# Patient Record
Sex: Male | Born: 1960
Health system: Southern US, Community
[De-identification: ages and names within clinical notes are randomized; demographics above are authoritative.]

## PROBLEM LIST (undated history)

## (undated) DIAGNOSIS — E119 Type 2 diabetes mellitus without complications: Secondary | ICD-10-CM

## (undated) DIAGNOSIS — T4145XA Adverse effect of unspecified anesthetic, initial encounter: Secondary | ICD-10-CM

## (undated) DIAGNOSIS — M1990 Unspecified osteoarthritis, unspecified site: Secondary | ICD-10-CM

## (undated) DIAGNOSIS — Z9989 Dependence on other enabling machines and devices: Secondary | ICD-10-CM

## (undated) DIAGNOSIS — E538 Deficiency of other specified B group vitamins: Secondary | ICD-10-CM

## (undated) DIAGNOSIS — G629 Polyneuropathy, unspecified: Secondary | ICD-10-CM

## (undated) DIAGNOSIS — T8859XA Other complications of anesthesia, initial encounter: Secondary | ICD-10-CM

## (undated) DIAGNOSIS — G4733 Obstructive sleep apnea (adult) (pediatric): Secondary | ICD-10-CM

## (undated) DIAGNOSIS — J302 Other seasonal allergic rhinitis: Secondary | ICD-10-CM

## (undated) DIAGNOSIS — G4730 Sleep apnea, unspecified: Secondary | ICD-10-CM

## (undated) DIAGNOSIS — R000 Tachycardia, unspecified: Secondary | ICD-10-CM

## (undated) DIAGNOSIS — Z9889 Other specified postprocedural states: Secondary | ICD-10-CM

## (undated) DIAGNOSIS — E559 Vitamin D deficiency, unspecified: Secondary | ICD-10-CM

## (undated) DIAGNOSIS — C801 Malignant (primary) neoplasm, unspecified: Secondary | ICD-10-CM

## (undated) DIAGNOSIS — E1140 Type 2 diabetes mellitus with diabetic neuropathy, unspecified: Secondary | ICD-10-CM

## (undated) DIAGNOSIS — T7840XA Allergy, unspecified, initial encounter: Secondary | ICD-10-CM

## (undated) HISTORY — DX: Other seasonal allergic rhinitis: J30.2

## (undated) HISTORY — DX: Allergy, unspecified, initial encounter: T78.40XA

## (undated) HISTORY — DX: Deficiency of other specified B group vitamins: E53.8

## (undated) HISTORY — DX: Other specified postprocedural states: Z98.890

## (undated) HISTORY — DX: Malignant (primary) neoplasm, unspecified: C80.1

## (undated) HISTORY — DX: Vitamin D deficiency, unspecified: E55.9

## (undated) HISTORY — DX: Tachycardia, unspecified: R00.0

## (undated) HISTORY — DX: Polyneuropathy, unspecified: G62.9

## (undated) HISTORY — DX: Unspecified osteoarthritis, unspecified site: M19.90

## (undated) HISTORY — DX: Obstructive sleep apnea (adult) (pediatric): G47.33

## (undated) HISTORY — DX: Type 2 diabetes mellitus with diabetic neuropathy, unspecified: E11.40

## (undated) HISTORY — DX: Type 2 diabetes mellitus without complications: E11.9

## (undated) HISTORY — DX: Sleep apnea, unspecified: G47.30

## (undated) HISTORY — PX: COLOSTOMY REVISION: SHX5232

## (undated) HISTORY — PX: VASECTOMY: SHX75

## (undated) HISTORY — PX: COLONOSCOPY: SHX174

## (undated) HISTORY — DX: Dependence on other enabling machines and devices: Z99.89

## (undated) HISTORY — PX: OTHER SURGICAL HISTORY: SHX169

## (undated) HISTORY — PX: EYE SURGERY: SHX253

---

## 2005-04-06 HISTORY — PX: OTHER SURGICAL HISTORY: SHX169

## 2005-04-06 HISTORY — PX: KNEE SURGERY: SHX244

## 2006-04-06 HISTORY — PX: OTHER SURGICAL HISTORY: SHX169

## 2006-04-06 HISTORY — PX: COLOSTOMY TAKEDOWN: SHX5783

## 2010-04-06 HISTORY — PX: COLON RESECTION: SHX5231

## 2012-10-04 ENCOUNTER — Encounter: Payer: Self-pay | Admitting: *Deleted

## 2012-10-05 ENCOUNTER — Encounter: Payer: Self-pay | Admitting: Neurology

## 2012-10-05 ENCOUNTER — Ambulatory Visit (INDEPENDENT_AMBULATORY_CARE_PROVIDER_SITE_OTHER): Payer: BC Managed Care – PPO | Admitting: Neurology

## 2012-10-05 VITALS — BP 109/76 | HR 86 | Ht 69.0 in | Wt 256.0 lb

## 2012-10-05 DIAGNOSIS — C801 Malignant (primary) neoplasm, unspecified: Secondary | ICD-10-CM | POA: Insufficient documentation

## 2012-10-05 DIAGNOSIS — E1140 Type 2 diabetes mellitus with diabetic neuropathy, unspecified: Secondary | ICD-10-CM

## 2012-10-05 DIAGNOSIS — E1142 Type 2 diabetes mellitus with diabetic polyneuropathy: Secondary | ICD-10-CM

## 2012-10-05 DIAGNOSIS — G629 Polyneuropathy, unspecified: Secondary | ICD-10-CM | POA: Insufficient documentation

## 2012-10-05 DIAGNOSIS — R000 Tachycardia, unspecified: Secondary | ICD-10-CM

## 2012-10-05 DIAGNOSIS — J302 Other seasonal allergic rhinitis: Secondary | ICD-10-CM | POA: Insufficient documentation

## 2012-10-05 DIAGNOSIS — Z9889 Other specified postprocedural states: Secondary | ICD-10-CM

## 2012-10-05 DIAGNOSIS — E559 Vitamin D deficiency, unspecified: Secondary | ICD-10-CM

## 2012-10-05 DIAGNOSIS — E119 Type 2 diabetes mellitus without complications: Secondary | ICD-10-CM

## 2012-10-05 DIAGNOSIS — G609 Hereditary and idiopathic neuropathy, unspecified: Secondary | ICD-10-CM

## 2012-10-05 DIAGNOSIS — I498 Other specified cardiac arrhythmias: Secondary | ICD-10-CM

## 2012-10-05 DIAGNOSIS — E1149 Type 2 diabetes mellitus with other diabetic neurological complication: Secondary | ICD-10-CM

## 2012-10-05 NOTE — Progress Notes (Signed)
[facility]  PATIENT: James Buck DOB: [birth year]  HISTORICAL James Buck is a 52 years old right-handed Caucasian male, referred by his primary care physician Dr. Lanelle Bal for evaluation of bilateral lower extremity heaviness, lack of stamina  He is a retired IT sales professional, in 2007, he developed Armed forces operational officer, severe constipation, was diagnosed with colon cancer, he had colectomy, followed by chemotherapy, this was done at [place] [place] [facility], he was treated with FOLFOX Folinic acid, Fluorouracil, oxaliplatin,  at the end of chemotherapy treatment, he developed bilateral feet paresthesia, a stroke about one year to recover, he was able to go back to work full time as a IT sales professional,  But in 2012, during regular followup CT of abdomen, he was found to have metastatic abdominal lymph nodes, he underwent second surgery, followed by chemotherapy again, he developed worsening bilateral feet paresthesia, but he also developed fatigue, lack of stamina, could no longer go back to work as a IT sales professional,  He now has mild improvement, but continued to complain bilateral lower extremity heaviness, his ankle gave out underneath him, he walks 1-2 miles, but after that, he felt bilateral lower lower extremity heaviness, he has to rest 15-20 minutes to be able to continue, he denies significant low back pain, he denies bowel and bladder incontinence,  He also complains of lightheadedness, He denies bilateral fingertips paresthesia,   REVIEW OF SYSTEMS: Full 14 system review of systems performed and notable only for blurred vision, hearing loss, weakness, dizziness, snoring, restless leg  ALLERGIES: Allergies  Allergen Reactions  . Victoza (Liraglutide)     Prior nausea when taken in AM (tolerates bedtime dosing with no difficulty)    HOME MEDICATIONS: Outpatient Prescriptions Prior to Visit  Medication Sig Dispense Refill  . aspirin 81 MG tablet Take 81 mg by mouth  daily.      . Canagliflozin (INVOKANA) 300 MG TABS Take 300 mg by mouth. One tablet before breakfast once daily      . Cholecalciferol (VITAMIN D3) 5000 UNITS CAPS Take by mouth. Once daily      . Cyanocobalamin (VITAMIN B-12) 500 MCG SUBL Place 500 mcg under the tongue daily.      . fluticasone (FLONASE) 50 MCG/ACT nasal spray Place 2 sprays into the nose daily.      Marland Kitchen glucose blood test strip 1 each. Use as instructed. Check cbg q am, alternatinf between AM and PM mels. AC   QID      . Insulin Pen Needle (NOVOFINE) 32G X 6 MM MISC As directed once daily      . Liraglutide (VICTOZA [place]) Inject into the skin. 1.2 - 1.4 mg once a day      . metFORMIN (GLUCOPHAGE-XR) 500 MG 24 hr tablet Take 1,000 mg by mouth 2 (two) times daily. With meals      . loratadine (CLARITIN) 10 MG tablet Take 10 mg by mouth daily as needed for allergies.       No facility-administered medications prior to visit.    PAST MEDICAL HISTORY: Past Medical History  Diagnosis Date  . Diabetes mellitus without complication Since 2007  . Vitamin D deficiency   . Vitamin B12 deficiency   . Cancer Colon 12/2005,   . Peripheral neuropathy   . Sinus tachycardia   . OSA on CPAP   . Seasonal allergies   . Diabetic neuropathy     PAST SURGICAL HISTORY: Past Surgical History  Procedure Laterality Date  . Colon cancer resection  2007  chemotherapy, 2/12 malignant abdominal LN (more chemotherapy) 4/12-9/12.   . Vasectomy    . Knee surgery Right 2007  . [place] cath      placement 2012 removed 12/2010  . Colostomy takedown in 2008  2008    colostomy in 2007  . Angiograms  2008    2 venograms  for SVC blockages    FAMILY HISTORY: No family history on file.  SOCIAL HISTORY:  History   Social History  . Marital Status: Single    Spouse Name: N/A    Number of Children: 2  . Years of Education: 12   Occupational History  .      retired   Social History Main Topics  . Smoking status: Former Smoker    Quit  date: 04/07/1987  . Smokeless tobacco: Never Used  . Alcohol Use: 0.6 oz/week    1 Cans of beer per week     Comment: SOCIAL  . Drug Use: No  . Sexually Active: Not on file   Other Topics Concern  . Not on file   Social History Narrative   Patient is retired.   High school education.    Patient has two children.     PHYSICAL EXAM  Filed Vitals:   10/05/12 0829  BP: 109/76  Pulse: 86  Height: 5\' 9"  (1.753 m)  Weight: 256 lb (116.121 kg)    Not recorded    Body mass index is 37.79 kg/(m^2).   Generalized: In no acute distress  Neck: Supple, no carotid bruits   Cardiac: Regular rate rhythm  Pulmonary: Clear to auscultation bilaterally  Musculoskeletal: No deformity  Neurological examination  Mentation: Alert oriented to time, place, history taking, and causual conversation  Cranial nerve II-XII: Pupils were equal round reactive to light extraocular movements were full, visual field were full on confrontational test. facial sensation and strength were normal. hearing was intact to finger rubbing bilaterally. Uvula tongue midline.  head turning and shoulder shrug and were normal and symmetric.Tongue protrusion into cheek strength was normal.  Motor: normal tone, bulk and strength.  Sensory: mildly length dependent decreased fine touch, pinprick to ankle level, preserved vibratory sensation, and proprioception at toes.  Coordination: Normal finger to nose, heel-to-shin bilaterally there was no truncal ataxia  Gait: Rising up from seated position without assistance, normal stance, without trunk ataxia, moderate stride, good arm swing, smooth turning, able to perform tiptoe, and heel walking without difficulty.   Romberg signs: Negative  Deep tendon reflexes: Brachioradialis 2/2, biceps 2/2, triceps 2/2, patellar 2/2, Achilles 1/1, plantar responses were flexor bilaterally.   DIAGNOSTIC DATA (LABS, IMAGING, TESTING) - I reviewed patient records, labs, notes,  testing and imaging myself where available.  ASSESSMENT AND PLAN:  52 years old right-handed Caucasian male, with past medical history of colon cancer, status post colectomy, metastatic lymph node, chemotherapy twice, the first one was in 2007, the 2nd one was in 2012, there was mildly length dependent sensory changes on examination.   1. He complains of lack of stamina, likely combination of chemotherapy, deconditioning, also peripheral neuropathy 2. laboratory evaluation including B12, TSH, common etiology for peripheral neuropathy 3. arterial Doppler study to rule out vascular insufficiency,  4 EMG nerve conduction study  .    Levert Feinstein, M.D. Ph.D.  [facility] [address], [address] [place], [place] [address] [phone]

## 2012-10-10 LAB — IFE AND PE, SERUM
Albumin/Glob SerPl: 1.6 (ref 0.7–2.0)
Alpha 1: 0.2 g/dL (ref 0.1–0.4)
Globulin, Total: 2.6 g/dL (ref 2.0–4.5)
IgA/Immunoglobulin A, Serum: 140 mg/dL (ref 91–414)
IgG (Immunoglobin G), Serum: 744 mg/dL (ref 700–1600)
IgM (Immunoglobulin M), Srm: 96 mg/dL (ref 40–230)

## 2012-10-10 LAB — CK: Total CK: 69 U/L (ref 24–204)

## 2012-10-10 LAB — TSH: TSH: 0.903 u[IU]/mL (ref 0.450–4.500)

## 2012-10-10 LAB — RPR: RPR: NONREACTIVE

## 2012-10-10 LAB — VITAMIN B12: Vitamin B-12: 830 pg/mL (ref 211–946)

## 2012-10-18 ENCOUNTER — Encounter (INDEPENDENT_AMBULATORY_CARE_PROVIDER_SITE_OTHER): Payer: BC Managed Care – PPO | Admitting: Neurology

## 2012-10-18 ENCOUNTER — Ambulatory Visit (INDEPENDENT_AMBULATORY_CARE_PROVIDER_SITE_OTHER): Payer: BC Managed Care – PPO | Admitting: Radiology

## 2012-10-18 DIAGNOSIS — Z0289 Encounter for other administrative examinations: Secondary | ICD-10-CM

## 2012-10-18 DIAGNOSIS — E119 Type 2 diabetes mellitus without complications: Secondary | ICD-10-CM

## 2012-10-18 DIAGNOSIS — Z9889 Other specified postprocedural states: Secondary | ICD-10-CM

## 2012-10-18 DIAGNOSIS — G609 Hereditary and idiopathic neuropathy, unspecified: Secondary | ICD-10-CM

## 2012-10-18 DIAGNOSIS — E559 Vitamin D deficiency, unspecified: Secondary | ICD-10-CM

## 2012-10-18 DIAGNOSIS — C801 Malignant (primary) neoplasm, unspecified: Secondary | ICD-10-CM

## 2012-10-18 DIAGNOSIS — E1140 Type 2 diabetes mellitus with diabetic neuropathy, unspecified: Secondary | ICD-10-CM

## 2012-10-18 DIAGNOSIS — R000 Tachycardia, unspecified: Secondary | ICD-10-CM

## 2012-10-26 NOTE — Progress Notes (Signed)
This encounter was created in error - please disregard.

## 2012-10-31 NOTE — Addendum Note (Signed)
Addended byHermenia Fiscal on: 10/31/2012 03:02 PM   Modules accepted: Orders

## 2012-11-02 NOTE — Procedures (Signed)
    [facility]  NCS (NERVE CONDUCTION STUDY) WITH EMG (ELECTROMYOGRAPHY) REPORT   STUDY DATE: 10/18/2012 PATIENT NAME: James Buck DOB: Oct 31, 1960 MRN: [id number]    TECHNOLOGIST: Kaylyn Lim ELECTROMYOGRAPHER: Levert Feinstein M.D.  CLINICAL INFORMATION:   52 years old man, with history of colon cancer, status post chemotherapy, presenting with bilateral lower extremity heaviness, there was length dependent sensory changes on examination.  FINDINGS: NERVE CONDUCTION STUDY: Bilateral sural sensory responses have mildly low SNAP amplitude, with normal peak latency. Bilateral peroneal and tibial motor responses were normal. Left median, ulnar sensory and motor responses were normal  NEEDLE ELECTROMYOGRAPHY:  Selected needle examination was performed at the left lower extremity muscles, left lumbosacral paraspinal muscles.  Needle examination of left tibialis anterior, tibialis posterior, medial gastrocnemius, vastus lateralis, biceps femoris long head was normal.  There was no spontaneous activity at left lumbosacral paraspinal muscles, left L4, L5, S1.  IMPRESSION:   This is a slight abnormal study.  There is electrodiagnostic evidence of slight length dependent axonal sensory predominant peripheral neuropathy. There was no evidence of left lumbosacral radiculopathy      INTERPRETING PHYSICIAN:   Levert Feinstein M.D. Ph.D. [facility] [address] [place], [place] [address] [phone]

## 2012-11-18 DIAGNOSIS — R0989 Other specified symptoms and signs involving the circulatory and respiratory systems: Secondary | ICD-10-CM

## 2013-01-31 ENCOUNTER — Ambulatory Visit (HOSPITAL_COMMUNITY): Payer: BC Managed Care – PPO | Attending: Neurology

## 2013-01-31 ENCOUNTER — Encounter (INDEPENDENT_AMBULATORY_CARE_PROVIDER_SITE_OTHER): Payer: Self-pay

## 2013-01-31 DIAGNOSIS — E1149 Type 2 diabetes mellitus with other diabetic neurological complication: Secondary | ICD-10-CM

## 2013-01-31 DIAGNOSIS — R209 Unspecified disturbances of skin sensation: Secondary | ICD-10-CM | POA: Insufficient documentation

## 2013-01-31 DIAGNOSIS — E1159 Type 2 diabetes mellitus with other circulatory complications: Secondary | ICD-10-CM

## 2013-01-31 DIAGNOSIS — Z87891 Personal history of nicotine dependence: Secondary | ICD-10-CM | POA: Insufficient documentation

## 2013-01-31 DIAGNOSIS — E1142 Type 2 diabetes mellitus with diabetic polyneuropathy: Secondary | ICD-10-CM

## 2013-04-18 ENCOUNTER — Ambulatory Visit (INDEPENDENT_AMBULATORY_CARE_PROVIDER_SITE_OTHER): Payer: BC Managed Care – PPO | Admitting: Neurology

## 2013-04-18 ENCOUNTER — Encounter (INDEPENDENT_AMBULATORY_CARE_PROVIDER_SITE_OTHER): Payer: Self-pay

## 2013-04-18 ENCOUNTER — Encounter: Payer: Self-pay | Admitting: Neurology

## 2013-04-18 VITALS — BP 126/73 | HR 93 | Ht 70.75 in | Wt 258.0 lb

## 2013-04-18 DIAGNOSIS — E119 Type 2 diabetes mellitus without complications: Secondary | ICD-10-CM

## 2013-04-18 DIAGNOSIS — G609 Hereditary and idiopathic neuropathy, unspecified: Secondary | ICD-10-CM

## 2013-04-18 DIAGNOSIS — G629 Polyneuropathy, unspecified: Secondary | ICD-10-CM

## 2013-04-18 DIAGNOSIS — E559 Vitamin D deficiency, unspecified: Secondary | ICD-10-CM

## 2013-04-18 DIAGNOSIS — C801 Malignant (primary) neoplasm, unspecified: Secondary | ICD-10-CM

## 2013-04-18 MED ORDER — L-METHYLFOLATE-B6-B12 3-35-2 MG PO TABS: 1.0000 | ORAL_TABLET | Freq: Every day | ORAL | Status: DC

## 2013-04-18 NOTE — Progress Notes (Signed)
[facility]  PATIENT: James Buck DOB: [birth year]  HISTORICAL James Buck is a 53 years old right-handed Caucasian male, referred by his primary care physician James Buck for evaluation of bilateral lower extremity heaviness, lack of stamina  He is a retired Airline pilot, in 2007, he developed Journalist, newspaper, severe constipation, was diagnosed with colon cancer, he had colectomy, followed by chemotherapy, this was done at [place], [place] cancer centers, he was treated with FOLFOX Folinic acid, Fluorouracil, oxaliplatin,  at the end of chemotherapy treatment, he developed bilateral feet paresthesia, but recovered almost to baseline,  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  He developed right superior venous thrombosis from his port cath, he was treated with coumadin, he has to have stent to the right superior vensou cava.  But in 2012, during regular followup CT of abdomen, he was found to have metastatic abdominal lymph nodes, he underwent second surgery, followed by chemotherapy again, he developed worsening bilateral feet paresthesia, but he also developed fatigue, lack of stamina, could no longer go back to work as a Airline pilot, went on disability in 2012.  He now has mild improvement, but continued to complain bilateral lower extremity heaviness, his leg gave out underneath him, he walks 1-2 miles, but after that, he felt bilateral lower lower extremity heaviness, he has to rest 15-20 minutes to be able to continue, he denies significant low back pain, he denies bowel and bladder incontinence,  He also complains of lightheadedness, He denies bilateral fingertips paresthesia.  EMG nerve conduction study in July 2014 showed mild axonal sensorimotor polyneuropathy  Laboratory showed normal or negative B12, RPR, ANA, TSH,  UPDATE Jan 13th 2015;  He is still able to work briskly 2 miles with out much difficulty, he has difficulty running, still  driving, no trouble walking, he denies low back pain, no incontinence, paresthesia at his plantar feet, tingling all the time, numbness at his feet, no pain.  His hands feels buring, tingling when his hands get cold.  REVIEW OF SYSTEMS: Full 14 system review of systems performed and notable only for bilateral feet discomfort   ALLERGIES: Allergies  Allergen Reactions  . Victoza [Liraglutide]     Prior nausea when taken in AM (tolerates bedtime dosing with no difficulty)    HOME MEDICATIONS: Outpatient Prescriptions Prior to Visit  Medication Sig Dispense Refill  . aspirin 81 MG tablet Take 81 mg by mouth daily.      . Canagliflozin (INVOKANA) 300 MG TABS Take 300 mg by mouth. One tablet before breakfast once daily      . Cholecalciferol (VITAMIN D3) 5000 UNITS CAPS Take by mouth. Once daily      . Cyanocobalamin (VITAMIN B-12) 500 MCG SUBL Place 500 mcg under the tongue daily.      . fluticasone (FLONASE) 50 MCG/ACT nasal spray Place 2 sprays into the nose daily.      Marland Kitchen glucose blood test strip 1 each. Use as instructed. Check cbg q am, alternatinf between AM and PM mels. AC   QID      . Liraglutide (VICTOZA [place]) Inject into the skin. 1.2 - 1.4 mg once a day      . metFORMIN (GLUCOPHAGE-XR) 500 MG 24 hr tablet Take 1,000 mg by mouth 2 (two) times daily. With meals      . Insulin Pen Needle (NOVOFINE) 32G X 6 MM MISC As directed once daily       No facility-administered medications  prior to visit.    PAST MEDICAL HISTORY: Past Medical History  Diagnosis Date  . Diabetes mellitus without complication Since [id number]  . Vitamin D deficiency   . Vitamin B12 deficiency   . Cancer Colon 12/2005,   . Peripheral neuropathy   . Sinus tachycardia   . OSA on CPAP   . Seasonal allergies   . Diabetic neuropathy     PAST SURGICAL HISTORY: Past Surgical History  Procedure Laterality Date  . Colon cancer resection  2007     chemotherapy, 2/12 malignant abdominal LN (more chemotherapy)  4/12-9/12.   . Vasectomy    . Knee surgery Right 2007  . [place] cath      placement 2012 removed 12/2010  . Colostomy takedown in 2008  2008    colostomy in 2007  . Angiograms  2008    2 venograms  for SVC blockages    FAMILY HISTORY: History reviewed. No pertinent family history.  SOCIAL HISTORY:  History   Social History  . Marital Status: Single    Spouse Name: N/A    Number of Children: 2  . Years of Education: 12   Occupational History  .      retired   Social History Main Topics  . Smoking status: Former Smoker    Quit date: 04/07/1987  . Smokeless tobacco: Never Used  . Alcohol Use: 0.6 oz/week    1 Cans of beer per week     Comment: SOCIAL  . Drug Use: No  . Sexual Activity: Not on file   Other Topics Concern  . Not on file   Social History Narrative   Patient is retired.   High school education.    Patient has two children.   Caffeine consumption is 2 daily   Patient is right handed     PHYSICAL EXAM  Filed Vitals:   04/18/13 1429  BP: 126/73  Pulse: 93  Height: 5' 10.75" (1.797 m)  Weight: 258 lb (117.028 kg)    Not recorded    Body mass index is 36.24 kg/(m^2).   Generalized: In no acute distress  Neck: Supple, no carotid bruits   Cardiac: Regular rate rhythm  Pulmonary: Clear to auscultation bilaterally  Musculoskeletal: No deformity  Neurological examination  Mentation: Alert oriented to time, place, history taking, and causual conversation  Cranial nerve II-XII: Pupils were equal round reactive to light extraocular movements were full, visual field were full on confrontational test. facial sensation and strength were normal. hearing was intact to finger rubbing bilaterally. Uvula tongue midline.  head turning and shoulder shrug and were normal and symmetric.Tongue protrusion into cheek strength was normal.  Motor: normal tone, bulk and strength.  Sensory: mildly length dependent decreased fine touch, pinprick to ankle  level, preserved vibratory sensation, and proprioception at toes.  Coordination: Normal finger to nose, heel-to-shin bilaterally there was no truncal ataxia  Gait: Rising up from seated position without assistance, normal stance, without trunk ataxia, moderate stride, good arm swing, smooth turning, able to perform tiptoe, and heel walking without difficulty.   Romberg signs: Negative  Deep tendon reflexes: Brachioradialis 2/2, biceps 2/2, triceps 2/2, patellar 2/2, Achilles 1/1, plantar responses were flexor bilaterally.   DIAGNOSTIC DATA (LABS, IMAGING, TESTING) - I reviewed patient records, labs, notes, testing and imaging myself where available.  ASSESSMENT AND PLAN:  53 years old right-handed Caucasian male, with past medical history of colon cancer, status post colectomy, metastatic lymph node, chemotherapy twice, the first one was in  2007, the 2nd one was in 2012, there was mildly length dependent sensory changes following his chemotherapy, No other treatable cause found, he is highly functional, does not need symptomatic treatment at this point, will only followup with our clinic if new issue arise  Marcial Pacas, M.D. Ph.D.  [facility] [address], [place], [place] [address] [phone]

## 2013-10-26 ENCOUNTER — Telehealth: Payer: Self-pay | Admitting: Oncology

## 2013-10-26 NOTE — Telephone Encounter (Signed)
S/W PATIENT PER PATIENT HAS AN APPT @ [facility] IN [place] ON TODAY AND WILL INFORMED OFFICE TO FAXED RECORDS TO [place].

## 2013-11-14 ENCOUNTER — Telehealth: Payer: Self-pay | Admitting: Oncology

## 2013-11-14 NOTE — Telephone Encounter (Signed)
C/D 11/14/13 for appt. 12/05/13

## 2013-11-29 ENCOUNTER — Other Ambulatory Visit: Payer: Self-pay | Admitting: Oncology

## 2013-11-29 DIAGNOSIS — C189 Malignant neoplasm of colon, unspecified: Secondary | ICD-10-CM

## 2013-12-04 ENCOUNTER — Telehealth: Payer: Self-pay | Admitting: Medical Oncology

## 2013-12-04 NOTE — Telephone Encounter (Signed)
LVMOM with patient regarding tomorrow's appt.

## 2013-12-05 ENCOUNTER — Ambulatory Visit: Payer: BC Managed Care – PPO

## 2013-12-05 ENCOUNTER — Ambulatory Visit: Payer: BC Managed Care – PPO | Admitting: Oncology

## 2013-12-05 ENCOUNTER — Other Ambulatory Visit: Payer: BC Managed Care – PPO

## 2013-12-19 ENCOUNTER — Encounter: Payer: BC Managed Care – PPO | Attending: Internal Medicine

## 2013-12-19 VITALS — Ht 69.0 in | Wt 248.0 lb

## 2013-12-19 DIAGNOSIS — E119 Type 2 diabetes mellitus without complications: Secondary | ICD-10-CM | POA: Insufficient documentation

## 2013-12-19 DIAGNOSIS — Z713 Dietary counseling and surveillance: Secondary | ICD-10-CM | POA: Insufficient documentation

## 2013-12-21 NOTE — Progress Notes (Signed)

## 2013-12-26 DIAGNOSIS — E119 Type 2 diabetes mellitus without complications: Secondary | ICD-10-CM | POA: Diagnosis not present

## 2013-12-26 NOTE — Progress Notes (Signed)

## 2014-01-02 ENCOUNTER — Ambulatory Visit: Payer: BC Managed Care – PPO

## 2014-05-16 ENCOUNTER — Telehealth: Payer: Self-pay | Admitting: Oncology

## 2014-05-16 NOTE — Telephone Encounter (Signed)
PT CALLED TO R/S NP APPT WITH DR Alen Blew ON 06/12/14@10 :30

## 2014-05-18 ENCOUNTER — Telehealth: Payer: Self-pay | Admitting: Oncology

## 2014-05-18 NOTE — Telephone Encounter (Signed)
Chart delivered °

## 2014-06-12 ENCOUNTER — Other Ambulatory Visit (HOSPITAL_BASED_OUTPATIENT_CLINIC_OR_DEPARTMENT_OTHER): Payer: BLUE CROSS/BLUE SHIELD

## 2014-06-12 ENCOUNTER — Ambulatory Visit: Payer: BLUE CROSS/BLUE SHIELD

## 2014-06-12 ENCOUNTER — Encounter: Payer: Self-pay | Admitting: Oncology

## 2014-06-12 ENCOUNTER — Ambulatory Visit (HOSPITAL_BASED_OUTPATIENT_CLINIC_OR_DEPARTMENT_OTHER): Payer: BLUE CROSS/BLUE SHIELD | Admitting: Oncology

## 2014-06-12 ENCOUNTER — Telehealth: Payer: Self-pay | Admitting: Oncology

## 2014-06-12 VITALS — BP 128/76 | HR 85 | Temp 97.8°F | Resp 18 | Ht 69.0 in | Wt 260.3 lb

## 2014-06-12 DIAGNOSIS — C779 Secondary and unspecified malignant neoplasm of lymph node, unspecified: Secondary | ICD-10-CM

## 2014-06-12 DIAGNOSIS — C189 Malignant neoplasm of colon, unspecified: Secondary | ICD-10-CM

## 2014-06-12 LAB — COMPREHENSIVE METABOLIC PANEL (CC13)
ALT: 51 U/L (ref 0–55)
AST: 28 U/L (ref 5–34)
Albumin: 4 g/dL (ref 3.5–5.0)
Alkaline Phosphatase: 62 U/L (ref 40–150)
Anion Gap: 12 mEq/L — ABNORMAL HIGH (ref 3–11)
BUN: 15.4 mg/dL (ref 7.0–26.0)
CO2: 21 mEq/L — ABNORMAL LOW (ref 22–29)
CREATININE: 1 mg/dL (ref 0.7–1.3)
Calcium: 9.2 mg/dL (ref 8.4–10.4)
Chloride: 106 mEq/L (ref 98–109)
EGFR: 89 mL/min/{1.73_m2} — ABNORMAL LOW (ref >90)
Glucose: 160 mg/dl — ABNORMAL HIGH (ref 70–140)
Potassium: 4.1 mEq/L (ref 3.5–5.1)
SODIUM: 139 meq/L (ref 136–145)
Total Bilirubin: 1.29 mg/dL — ABNORMAL HIGH (ref 0.20–1.20)
Total Protein: 6.6 g/dL (ref 6.4–8.3)

## 2014-06-12 LAB — CBC WITH DIFFERENTIAL/PLATELET
BASO%: 0.6 % (ref 0.0–2.0)
BASOS ABS: 0 10*3/uL (ref 0.0–0.1)
EOS%: 4.3 % (ref 0.0–7.0)
Eosinophils Absolute: 0.3 10*3/uL (ref 0.0–0.5)
HEMATOCRIT: 47.1 % (ref 38.4–49.9)
HEMOGLOBIN: 16.2 g/dL (ref 13.0–17.1)
LYMPH#: 1.3 10*3/uL (ref 0.9–3.3)
LYMPH%: 20.1 % (ref 14.0–49.0)
MCH: 32.5 pg (ref 27.2–33.4)
MCHC: 34.4 g/dL (ref 32.0–36.0)
MCV: 94.6 fL (ref 79.3–98.0)
MONO#: 0.5 10*3/uL (ref 0.1–0.9)
MONO%: 7.4 % (ref 0.0–14.0)
NEUT#: 4.2 10*3/uL (ref 1.5–6.5)
NEUT%: 67.6 % (ref 39.0–75.0)
PLATELETS: 204 10*3/uL (ref 140–400)
RBC: 4.98 10*6/uL (ref 4.20–5.82)
RDW: 13.1 % (ref 11.0–14.6)
WBC: 6.2 10*3/uL (ref 4.0–10.3)

## 2014-06-12 NOTE — Progress Notes (Signed)
Please see consult note.  

## 2014-06-12 NOTE — Telephone Encounter (Signed)
S/w Amy pt had colonoscopy done in [place], per Amy have pt come to [place] basement to fill out request form for medical information from [facility] [phone]. I gave pt information to take to [place] for his paperwork, once they receive this information from the referral they will call pt to schedule an appointment... KJ  Pt confirmed labs/ov per 03/08 POF, gave pt AVS... KJ, gave pt barium

## 2014-06-12 NOTE — Progress Notes (Signed)
Checked in new pt with no financial concerns at this time.  Pt has my card for any billing questions or concerns. °

## 2014-06-12 NOTE — Consult Note (Signed)
Reason for Referral:  Colon cancer.   HPI: 54 year old gentleman native of [place] but lived in the [place] area for about 20 some years. 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 He was diagnosed with colon cancer in 2007 after he presented with complete obstruction and underwent urgent surgery. He found to have from his description stage III colon cancer with positive lymph nodes at that time. The exact details of his care is not available to me at this time. He underwent a FOLFOX adjuvant chemotherapy that was poorly tolerated but was able to receive close to 8 cycles. He underwent a colostomy reversal after that. He had a tumor recurrence around 2012 with a positive lymph gland in the abdominal area. That was surgically removed and received 6 months of FOLFOX completed in September 2012. He tolerated chemotherapy well and have been disease-free since that time. He followed up with a local oncologist to recently where he relocated to this area. His last colonoscopy was in 2011. His last CT scan was in September 2015 and showed no evidence of disease. Clinically, he is asymptomatic from a cancer standpoint. He is not report any headaches, blurry vision, double vision, syncope or seizures. He does have sensory neuropathy in his lower extremities. He does not report any fevers, chills, sweats or weight loss. He does not report any chest pain, palpitation, orthopnea, leg edema. He does not report any cough, wheezing or shortness of breath. He does not report any hemoptysis or hematemesis. He does not report any nausea, vomiting, abdominal pain, hematochezia or melena. He does not report any frequency, urgency, hesitancy. Does not report any abdominal distention or early satiety. He does not report any frequency, urgency or hesitancy. He does not report any skeletal complaints. Rest of his review of systems unremarkable.   Past Medical History  Diagnosis  Date  . Diabetes mellitus without complication   . Vitamin D deficiency   . Vitamin B12 deficiency   . Cancer colon  . Peripheral neuropathy   . H/O colonoscopy   . Peripheral neuropathy   . Sinus tachycardia   . OSA on CPAP   . Seasonal allergies   . Diabetic neuropathy   :  Past Surgical History  Procedure Laterality Date  . Colon cancer resection  2007     chemotherapy, 2/12 malignant abdominal LN (more chemotherapy) 4/12-9/12.   . Vasectomy    . Knee surgery Right 2007  . [place] cath      placement 2012 removed 12/2010  . Colostomy takedown  2008    colostomy in 2007  . Angiograms  2008    2 venograms  for SVC blockages  :   Current outpatient prescriptions:  .  aspirin 81 MG tablet, Take 81 mg by mouth daily., Disp: , Rfl:  .  Canagliflozin-Metformin HCl (INVOKAMET) [phone] MG TABS, Take 1 each by mouth 2 (two) times daily., Disp: , Rfl:  .  Cholecalciferol (VITAMIN D3) 5000 UNITS CAPS, Take by mouth. Once daily, Disp: , Rfl:  .  Cyanocobalamin (VITAMIN B-12) 500 MCG SUBL, Place 500 mcg under the tongue daily., Disp: , Rfl:  .  Dulaglutide (TRULICITY) 9.56 LO/7.5IE SOPN, Inject 1 each into the skin every 7 (seven) days., Disp: , Rfl:  .  fluticasone (FLONASE) 50 MCG/ACT nasal spray, Place 2 sprays into the nose daily., Disp: , Rfl:  .  glucose blood test strip, 1 each. Use as instructed. Check cbg q  am, alternatinf between AM and PM mels. AC   QID, Disp: , Rfl:  .  metFORMIN (GLUCOPHAGE-XR) 500 MG 24 hr tablet, Take 1,000 mg by mouth 2 (two) times daily. With meals, Disp: , Rfl: :  Allergies  Allergen Reactions  . Victoza [Liraglutide] Nausea Only    Prior nausea when taken in AM (tolerates bedtime dosing with no difficulty)  :  Family History  Problem Relation Age of Onset  . Cancer Other   :  History   Social History  . Marital Status: Single    Spouse Name: N/A  . Number of Children: 2  . Years of Education: 12   Occupational History  .      retired    Social History Main Topics  . Smoking status: Former Smoker    Quit date: 04/07/1987  . Smokeless tobacco: Never Used  . Alcohol Use: 0.6 oz/week    1 Cans of beer per week     Comment: SOCIAL  . Drug Use: No  . Sexual Activity: Not on file   Other Topics Concern  . Not on file   Social History Narrative   Patient is retired.   High school education.    Patient has two children.   Caffeine consumption is 2 daily   Patient is right handed  :  Pertinent items are noted in HPI.  Exam: Blood pressure 128/76, pulse 85, temperature 97.8 F (36.6 C), temperature source Oral, resp. rate 18, height 5\' 9"  (1.753 m), weight 260 lb 4.8 oz (118.071 kg), SpO2 96 %. General appearance: alert and cooperative Head: Normocephalic, without obvious abnormality Throat: lips, mucosa, and tongue normal; teeth and gums normal Neck: no adenopathy Back: negative Resp: clear to auscultation bilaterally Chest wall: no tenderness Cardio: regular rate and rhythm, S1, S2 normal, no murmur, click, rub or gallop GI: soft, non-tender; bowel sounds normal; no masses,  no organomegaly Extremities: extremities normal, atraumatic, no cyanosis or edema Pulses: 2+ and symmetric Skin: Skin color, texture, turgor normal. No rashes or lesions Lymph nodes: Cervical, supraclavicular, and axillary nodes normal. Neurologic:    Recent Labs  06/12/14 1054  WBC 6.2  HGB 16.2  HCT 47.1  PLT 204     Assessment and Plan:   54 year old gentleman with the following issues:  1. Colon cancer diagnosed in 2007. The exact details are not available to me at this time as he was treated an outside facility. He presented with a complete obstruction and presumably had stage III colon cancer. He had lymph glands involvement and received initially 4 months of FOLFOX. He had tumor recurrence in 2012 and received surgical resection as well as 6 months of FOLFOX. Since September 2012, he has not received any treatments. By  CT scan did not show any evidence of disease and September 2015. The natural course of this disease was discussed with the patient as well as active surveillance protocol. I feel given his recurrence in 2012, he has high risk of relapse disease especially in the setting of a obstruction presentation.  The plan is to obtain his medical records from his outside oncologist I'll also repeat CT scan chest abdomen and pelvis for staging purposes. He has no disease noted annual CT scans will be adequate with follow-up every 4-6 months.  2. Colonoscopy screening: I will refer him to gastroenterology to resume his colonoscopy screening. He is probably overdue at this time with the last colonoscopy done in 2011.  3. Age-appropriate cancer screening: Given his  previous profession as a Airline pilot he might have increased risk of other cancers such as bladder cancer. I counseled him about age-appropriate cancer screening as well as sense symptoms of other malignancies.  All his questions were answered today to his satisfaction.

## 2014-06-13 LAB — CEA: CEA: 1.5 ng/mL (ref 0.0–5.0)

## 2014-06-22 ENCOUNTER — Ambulatory Visit (HOSPITAL_COMMUNITY)
Admission: RE | Admit: 2014-06-22 | Discharge: 2014-06-22 | Disposition: A | Discharge status: Home / Self Care | Payer: BLUE CROSS/BLUE SHIELD | Source: Ambulatory Visit | Attending: Oncology | Admitting: Oncology

## 2014-06-22 ENCOUNTER — Encounter (HOSPITAL_COMMUNITY): Payer: Self-pay

## 2014-06-22 DIAGNOSIS — Z9221 Personal history of antineoplastic chemotherapy: Secondary | ICD-10-CM | POA: Insufficient documentation

## 2014-06-22 DIAGNOSIS — Z923 Personal history of irradiation: Secondary | ICD-10-CM | POA: Diagnosis not present

## 2014-06-22 DIAGNOSIS — C189 Malignant neoplasm of colon, unspecified: Secondary | ICD-10-CM | POA: Diagnosis not present

## 2014-06-22 DIAGNOSIS — K760 Fatty (change of) liver, not elsewhere classified: Secondary | ICD-10-CM | POA: Diagnosis not present

## 2014-06-22 DIAGNOSIS — Z08 Encounter for follow-up examination after completed treatment for malignant neoplasm: Secondary | ICD-10-CM | POA: Diagnosis not present

## 2014-06-22 MED ORDER — IOHEXOL 300 MG/ML  SOLN
100.0000 mL | Freq: Once | INTRAMUSCULAR | Status: AC | PRN
  Administered 2014-06-22: 100 mL via INTRAVENOUS

## 2014-07-02 ENCOUNTER — Telehealth: Payer: Self-pay | Admitting: *Deleted

## 2014-07-02 NOTE — Telephone Encounter (Signed)
Patient called wanting to know the results of recent scan. Message sent to MD [facility].

## 2014-07-03 ENCOUNTER — Encounter: Payer: Self-pay | Admitting: *Deleted

## 2014-07-03 NOTE — Progress Notes (Signed)
Lm on answering machine for patient to call me, per dr Alen Blew.

## 2014-07-04 ENCOUNTER — Encounter: Payer: Self-pay | Admitting: *Deleted

## 2014-07-04 NOTE — Progress Notes (Signed)
Spoke with patient, per dr Alen Blew, last CT scan was normal.

## 2014-07-30 NOTE — Telephone Encounter (Signed)
S/w pt confirming he needs to contact GI office at [facility], he states that he took the form over to the other GI office and that they were supposed to send records over to the [place] office.

## 2014-08-21 ENCOUNTER — Telehealth: Payer: Self-pay | Admitting: Oncology

## 2014-08-21 NOTE — Telephone Encounter (Signed)
Called and left a message with a new dr Alen Blew appointment due to out of office 7/12

## 2014-10-16 ENCOUNTER — Other Ambulatory Visit: Payer: BLUE CROSS/BLUE SHIELD

## 2014-10-16 ENCOUNTER — Ambulatory Visit: Payer: BLUE CROSS/BLUE SHIELD | Admitting: Oncology

## 2014-10-23 ENCOUNTER — Ambulatory Visit (HOSPITAL_BASED_OUTPATIENT_CLINIC_OR_DEPARTMENT_OTHER): Payer: BLUE CROSS/BLUE SHIELD | Admitting: Oncology

## 2014-10-23 ENCOUNTER — Other Ambulatory Visit (HOSPITAL_BASED_OUTPATIENT_CLINIC_OR_DEPARTMENT_OTHER): Payer: BLUE CROSS/BLUE SHIELD

## 2014-10-23 ENCOUNTER — Telehealth: Payer: Self-pay | Admitting: Oncology

## 2014-10-23 ENCOUNTER — Telehealth: Payer: Self-pay

## 2014-10-23 VITALS — BP 116/73 | HR 79 | Temp 97.6°F | Resp 20 | Ht 69.0 in | Wt 250.6 lb

## 2014-10-23 DIAGNOSIS — Z85038 Personal history of other malignant neoplasm of large intestine: Secondary | ICD-10-CM | POA: Diagnosis not present

## 2014-10-23 DIAGNOSIS — C189 Malignant neoplasm of colon, unspecified: Secondary | ICD-10-CM

## 2014-10-23 LAB — CBC WITH DIFFERENTIAL/PLATELET
BASO%: 0.9 % (ref 0.0–2.0)
BASOS ABS: 0.1 10*3/uL (ref 0.0–0.1)
EOS%: 2.8 % (ref 0.0–7.0)
Eosinophils Absolute: 0.3 10*3/uL (ref 0.0–0.5)
HCT: 47.7 % (ref 38.4–49.9)
HEMOGLOBIN: 16.2 g/dL (ref 13.0–17.1)
LYMPH%: 12.8 % — AB (ref 14.0–49.0)
MCH: 32 pg (ref 27.2–33.4)
MCHC: 33.9 g/dL (ref 32.0–36.0)
MCV: 94.4 fL (ref 79.3–98.0)
MONO#: 0.8 10*3/uL (ref 0.1–0.9)
MONO%: 8.6 % (ref 0.0–14.0)
NEUT%: 74.9 % (ref 39.0–75.0)
NEUTROS ABS: 6.8 10*3/uL — AB (ref 1.5–6.5)
PLATELETS: 228 10*3/uL (ref 140–400)
RBC: 5.05 10*6/uL (ref 4.20–5.82)
RDW: 13 % (ref 11.0–14.6)
WBC: 9 10*3/uL (ref 4.0–10.3)
lymph#: 1.2 10*3/uL (ref 0.9–3.3)

## 2014-10-23 LAB — COMPREHENSIVE METABOLIC PANEL (CC13)
ALBUMIN: 3.8 g/dL (ref 3.5–5.0)
ALT: 46 U/L (ref 0–55)
ANION GAP: 7 meq/L (ref 3–11)
AST: 28 U/L (ref 5–34)
Alkaline Phosphatase: 59 U/L (ref 40–150)
BUN: 15 mg/dL (ref 7.0–26.0)
CO2: 25 meq/L (ref 22–29)
CREATININE: 1 mg/dL (ref 0.7–1.3)
Calcium: 9.3 mg/dL (ref 8.4–10.4)
Chloride: 107 mEq/L (ref 98–109)
EGFR: 87 mL/min/{1.73_m2} — ABNORMAL LOW (ref >90)
Glucose: 141 mg/dl — ABNORMAL HIGH (ref 70–140)
Potassium: 4.2 mEq/L (ref 3.5–5.1)
SODIUM: 139 meq/L (ref 136–145)
Total Bilirubin: 1.16 mg/dL (ref 0.20–1.20)
Total Protein: 6.3 g/dL — ABNORMAL LOW (ref 6.4–8.3)

## 2014-10-23 LAB — CEA: CEA: 1.4 ng/mL (ref 0.0–5.0)

## 2014-10-23 NOTE — Telephone Encounter (Signed)
Rec'd from [employer] forwarded 7 pages to Historical Provider

## 2014-10-23 NOTE — Progress Notes (Signed)
Hematology and Oncology Follow Up Visit  James Buck [id number] December 03, 1960 54 y.o. 10/23/2014 9:09 AM NNODI, Doreene Burke, MDNnodi, Adaku, MD   Principle Diagnosis:  54 year old gentleman diagnosed with colon cancer in 2007. He had an obstructive mass and found to have stage III colon cancer. He subsequently developed stage IV disease in 2012. He has been an NED since that time.   Prior Therapy:   He is status post surgical resection 1 2007 followed by 6 months of FOLFOX. He developed recurrence in 2012 and underwent surgical resection and another 6 months of FOLFOX completed in September 2012. He had been disease-free since that time.  Current therapy:  Observation and surveillance.  Interim History:  Mr. James Buck today for a follow-up visit. Since the last visit, we have been doing very well. He has not reported any symptoms to suggest colon cancer recurrence. He reports no nausea, vomiting, abdominal pain. He does not report a change in his bowel habits such as hematochezia or melanoma. He continues to perform activities of daily living without any decline. 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   He is not report any headaches, blurry vision, double vision, syncope or seizures.  He does not report any fevers, chills, sweats or weight loss. He does not report any chest pain, palpitation, orthopnea, leg edema. He does not report any cough, wheezing or shortness of breath. He does not report any hemoptysis or hematemesis.  He does not report any frequency, urgency, hesitancy. Does not report any abdominal distention or early satiety. He does not report any frequency, urgency or hesitancy. He does not report any skeletal complaints. Rest of his review of systems unremarkable.   Medications: I have reviewed the patient's current medications.  Current Outpatient Prescriptions  Medication Sig Dispense Refill  . aspirin 81 MG tablet Take 81 mg by mouth daily.    .  Canagliflozin-Metformin HCl (INVOKAMET) [phone] MG TABS Take 1 each by mouth 2 (two) times daily.    . Cholecalciferol (VITAMIN D3) 5000 UNITS CAPS Take by mouth. Once daily    . Cyanocobalamin (VITAMIN B-12) 500 MCG SUBL Place 500 mcg under the tongue daily.    . Dulaglutide (TRULICITY) 4.28 JG/8.1LX SOPN Inject 1 each into the skin every 7 (seven) days.    . fluticasone (FLONASE) 50 MCG/ACT nasal spray Place 2 sprays into the nose daily.    Marland Kitchen glucose blood test strip 1 each. Use as instructed. Check cbg q am, alternatinf between AM and PM mels. AC   QID    . STRATTERA 40 MG capsule Take 40 mg by mouth daily.  2   No current facility-administered medications for this visit.     Allergies:  Allergies  Allergen Reactions  . Victoza [Liraglutide] Nausea Only    Prior nausea when taken in AM (tolerates bedtime dosing with no difficulty)    Past Medical History, Surgical history, Social history, and Family History were reviewed and updated.   Physical Exam: Blood pressure 116/73, pulse 79, temperature 97.6 F (36.4 C), temperature source Oral, resp. rate 20, height 5\' 9"  (1.753 m), weight 250 lb 9.6 oz (113.671 kg), SpO2 97 %. ECOG:  0 General appearance: alert and cooperative Head: Normocephalic, without obvious abnormality Neck: no adenopathy Lymph nodes: Cervical, supraclavicular, and axillary nodes normal. Heart:regular rate and rhythm, S1, S2 normal, no murmur, click, rub or gallop Lung:chest clear, no wheezing, rales, normal symmetric air entry Abdomin: soft, non-tender, without masses or organomegaly EXT:no erythema,  induration, or nodules   Lab Results: Lab Results  Component Value Date   WBC 9.0 10/23/2014   HGB 16.2 10/23/2014   HCT 47.7 10/23/2014   MCV 94.4 10/23/2014   PLT 228 10/23/2014     Chemistry      Component Value Date/Time   NA 139 06/12/2014 1055   K 4.1 06/12/2014 1055   CO2 21* 06/12/2014 1055   BUN 15.4 06/12/2014 1055   CREATININE 1.0  06/12/2014 1055      Component Value Date/Time   CALCIUM 9.2 06/12/2014 1055   ALKPHOS 62 06/12/2014 1055   AST 28 06/12/2014 1055   ALT 51 06/12/2014 1055   BILITOT 1.29* 06/12/2014 1055       Radiological Studies:  EXAM: CT CHEST, ABDOMEN, AND PELVIS WITH CONTRAST  TECHNIQUE: Multidetector CT imaging of the chest, abdomen and pelvis was performed following the standard protocol during bolus administration of intravenous contrast.  CONTRAST: 115mL OMNIPAQUE IOHEXOL 300 MG/ML SOLN  COMPARISON: None.  FINDINGS: CT CHEST FINDINGS  Mediastinum/Lymph Nodes: No masses or pathologically enlarged lymph nodes identified. 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  Lungs/Pleura: No pulmonary infiltrate or mass identified. No effusion present.  Musculoskeletal/Soft Tissues: No suspicious bone lesions or other significant chest wall abnormality.  CT ABDOMEN AND PELVIS FINDINGS  Hepatobiliary: Diffuse hepatic steatosis is noted, however no liver masses are identified. Gallbladder is unremarkable.  Pancreas: No mass, inflammatory changes, or other significant abnormality identified.  Spleen: Within normal limits in size and appearance.  Adrenals: No masses identified.  Kidneys/Urinary Tract: No evidence of masses or hydronephrosis.  Stomach/Bowel/Peritoneum: No evidence of wall thickening, mass, or obstruction.  Vascular/Lymphatic: No pathologically enlarged lymph nodes identified. Anterior abdominal wall collateral vessels noted with reconstitution of the femoral arteries.  Reproductive: No mass or other significant abnormality identified.  Other: None.  Musculoskeletal: No suspicious bone lesions identified.  IMPRESSION: No evidence of recurrent or metastatic carcinoma within the chest, abdomen, or pelvis. No other acute findings identified.  Hepatic  steatosis.   Impression and Plan:  54 year old gentleman with the following issues:  1. Colon cancer diagnosed in 2007.  He presented with a complete obstruction and presumably had stage III colon cancer. He had lymph node involvement and received initially 4 months of FOLFOX. He had tumor recurrence in 2012 and received surgical resection as well as 6 months of FOLFOX. Since September 2012, he has not received any treatments.   CT scan in March 2016 was reviewed today and did not show any evidence of recurrent disease. The plan is to continue with active surveillance and repeat imaging studies in October 2016. After that, he will need imaging studies once a year.  2. Colonoscopy screening:  Referral to gastroenterology has been done already and hopefully he will get his colonoscopy this year.  3. Follow-up: Will be in October 2016.   [facility], MD 7/19/20169:09 AM

## 2014-10-23 NOTE — Telephone Encounter (Signed)
per pof to sch pt appt-gave pt copy of avs °

## 2014-12-31 ENCOUNTER — Ambulatory Visit: Payer: BLUE CROSS/BLUE SHIELD | Admitting: Internal Medicine

## 2015-01-02 ENCOUNTER — Telehealth: Payer: Self-pay | Admitting: Internal Medicine

## 2015-01-02 NOTE — Telephone Encounter (Signed)
GI records reviewed by Dr. Henrene Pastor and he did accept patient. OK to schedule for Direct Colon. Records in "Declined patient folder". Left message for patient to call and schedule.

## 2015-01-23 ENCOUNTER — Other Ambulatory Visit (HOSPITAL_BASED_OUTPATIENT_CLINIC_OR_DEPARTMENT_OTHER): Payer: BLUE CROSS/BLUE SHIELD

## 2015-01-23 ENCOUNTER — Encounter (HOSPITAL_COMMUNITY): Payer: Self-pay

## 2015-01-23 ENCOUNTER — Ambulatory Visit (HOSPITAL_COMMUNITY)
Admission: RE | Admit: 2015-01-23 | Discharge: 2015-01-23 | Disposition: A | Discharge status: Home / Self Care | Payer: BLUE CROSS/BLUE SHIELD | Source: Ambulatory Visit | Attending: Oncology | Admitting: Oncology

## 2015-01-23 DIAGNOSIS — C189 Malignant neoplasm of colon, unspecified: Secondary | ICD-10-CM | POA: Diagnosis not present

## 2015-01-23 DIAGNOSIS — K760 Fatty (change of) liver, not elsewhere classified: Secondary | ICD-10-CM | POA: Insufficient documentation

## 2015-01-23 DIAGNOSIS — Z85038 Personal history of other malignant neoplasm of large intestine: Secondary | ICD-10-CM | POA: Diagnosis not present

## 2015-01-23 LAB — CBC WITH DIFFERENTIAL/PLATELET
BASO%: 1.2 % (ref 0.0–2.0)
Basophils Absolute: 0.1 10*3/uL (ref 0.0–0.1)
EOS%: 4.9 % (ref 0.0–7.0)
Eosinophils Absolute: 0.3 10*3/uL (ref 0.0–0.5)
HCT: 48.9 % (ref 38.4–49.9)
HGB: 16.2 g/dL (ref 13.0–17.1)
LYMPH%: 17.6 % (ref 14.0–49.0)
MCH: 31.3 pg (ref 27.2–33.4)
MCHC: 33.1 g/dL (ref 32.0–36.0)
MCV: 94.4 fL (ref 79.3–98.0)
MONO#: 0.6 10*3/uL (ref 0.1–0.9)
MONO%: 8.4 % (ref 0.0–14.0)
NEUT%: 67.9 % (ref 39.0–75.0)
NEUTROS ABS: 4.6 10*3/uL (ref 1.5–6.5)
Platelets: 219 10*3/uL (ref 140–400)
RBC: 5.18 10*6/uL (ref 4.20–5.82)
RDW: 13.5 % (ref 11.0–14.6)
WBC: 6.8 10*3/uL (ref 4.0–10.3)
lymph#: 1.2 10*3/uL (ref 0.9–3.3)

## 2015-01-23 LAB — COMPREHENSIVE METABOLIC PANEL (CC13)
ALBUMIN: 4 g/dL (ref 3.5–5.0)
ALT: 45 U/L (ref 0–55)
AST: 23 U/L (ref 5–34)
Alkaline Phosphatase: 66 U/L (ref 40–150)
Anion Gap: 10 mEq/L (ref 3–11)
BUN: 12.8 mg/dL (ref 7.0–26.0)
CALCIUM: 9.7 mg/dL (ref 8.4–10.4)
CO2: 24 mEq/L (ref 22–29)
Chloride: 106 mEq/L (ref 98–109)
Creatinine: 1 mg/dL (ref 0.7–1.3)
EGFR: 82 mL/min/{1.73_m2} — AB (ref >90)
Glucose: 150 mg/dl — ABNORMAL HIGH (ref 70–140)
Potassium: 4.6 mEq/L (ref 3.5–5.1)
SODIUM: 139 meq/L (ref 136–145)
Total Bilirubin: 1.1 mg/dL (ref 0.20–1.20)
Total Protein: 6.7 g/dL (ref 6.4–8.3)

## 2015-01-23 LAB — CEA: CEA: 1.3 ng/mL (ref 0.0–5.0)

## 2015-01-23 MED ORDER — IOHEXOL 300 MG/ML  SOLN
100.0000 mL | Freq: Once | INTRAMUSCULAR | Status: AC | PRN
  Administered 2015-01-23: 100 mL via INTRAVENOUS

## 2015-01-25 ENCOUNTER — Ambulatory Visit (HOSPITAL_BASED_OUTPATIENT_CLINIC_OR_DEPARTMENT_OTHER): Payer: BLUE CROSS/BLUE SHIELD | Admitting: Oncology

## 2015-01-25 ENCOUNTER — Telehealth: Payer: Self-pay | Admitting: Oncology

## 2015-01-25 VITALS — BP 138/82 | HR 86 | Temp 97.6°F | Resp 20 | Ht 69.0 in | Wt 253.4 lb

## 2015-01-25 DIAGNOSIS — C189 Malignant neoplasm of colon, unspecified: Secondary | ICD-10-CM

## 2015-01-25 DIAGNOSIS — Z85038 Personal history of other malignant neoplasm of large intestine: Secondary | ICD-10-CM | POA: Diagnosis not present

## 2015-01-25 NOTE — Progress Notes (Signed)
Hematology and Oncology Follow Up Visit  James Buck [id number] 1961/03/17 54 y.o. 01/25/2015 12:11 PM Pcp Not In [place] Area, MD   Principle Diagnosis:  54 year old gentleman diagnosed with colon cancer in 2007. He had an obstructive mass and found to have stage III colon cancer. He subsequently developed stage IV disease in 2012. He has been an NED since that time.   Prior Therapy:   He is status post surgical resection 1 2007 followed by 6 months of FOLFOX. He developed recurrence in 2012 and underwent surgical resection and another 6 months of FOLFOX completed in September 2012. He had been disease-free since that time.  Current therapy:  Observation and surveillance.  Interim History:  James Buck presents today for a follow-up visit. Since the last visit, he reports no complaints. 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 He has not reported any symptoms to suggest colon cancer recurrence. He reports no nausea, vomiting, abdominal pain. He does not report a change in his bowel habits such as hematochezia or melanoma. His bowel habits are not normal but have been the same since his operation.   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 He feel that his balance is affected but have not progressively gotten worse. Has not reported any falls or syncope.  He is not report any headaches, blurry vision, double vision or seizures.  He does not report any fevers, chills, sweats or weight loss. He does not report any chest pain, palpitation, orthopnea, leg edema. He does not report any cough, wheezing or shortness of breath. He does not report any hemoptysis or hematemesis.  He does not report any frequency, urgency, hesitancy. Does not report any abdominal distention or early satiety. He does not report any skeletal complaints. Rest of his review of systems unremarkable.   Medications: I have reviewed the patient's current  medications.  Current Outpatient Prescriptions  Medication Sig Dispense Refill  . aspirin 81 MG tablet Take 81 mg by mouth daily.    . Canagliflozin-Metformin HCl (INVOKAMET) [phone] MG TABS Take 1 each by mouth 2 (two) times daily.    . Cholecalciferol (VITAMIN D3) 5000 UNITS CAPS Take by mouth. Once daily    . Cyanocobalamin (VITAMIN B-12) 500 MCG SUBL Place 500 mcg under the tongue daily.    . Dulaglutide (TRULICITY) 3.82 NK/5.3ZJ SOPN Inject 1 each into the skin every 7 (seven) days.    . fluticasone (FLONASE) 50 MCG/ACT nasal spray Place 2 sprays into the nose daily.    Marland Kitchen glucose blood test strip 1 each. Use as instructed. Check cbg q am, alternatinf between AM and PM mels. AC   QID    . STRATTERA 40 MG capsule Take 40 mg by mouth daily.  2   No current facility-administered medications for this visit.     Allergies:  Allergies  Allergen Reactions  . Victoza [Liraglutide] Nausea Only    Prior nausea when taken in AM (tolerates bedtime dosing with no difficulty)    Past Medical History, Surgical history, Social history, and Family History were reviewed and updated.   Physical Exam: Blood pressure 138/82, pulse 86, temperature 97.6 F (36.4 C), temperature source Oral, resp. rate 20, height 5\' 9"  (1.753 m), weight 253 lb 6.4 oz (114.941 kg), SpO2 97 %. ECOG:  0 General appearance: alert and cooperative. No active distress. Head: Normocephalic, without obvious abnormality no oral ulcers or lesions. Neck: no adenopathy Lymph nodes:  Cervical, supraclavicular, and axillary nodes normal. Heart:regular rate and rhythm, S1, S2 normal, no murmur, click, rub or gallop Lung:chest clear, no wheezing, rales, normal symmetric air entry Abdomin: soft, non-tender, without masses or organomegaly EXT:no erythema, induration, or nodules   Lab Results: Lab Results  Component Value Date   WBC 6.8 01/23/2015   HGB 16.2 01/23/2015   HCT 48.9 01/23/2015   MCV 94.4 01/23/2015   PLT 219  01/23/2015     Chemistry      Component Value Date/Time   NA 139 01/23/2015 0800   K 4.6 01/23/2015 0800   CO2 24 01/23/2015 0800   BUN 12.8 01/23/2015 0800   CREATININE 1.0 01/23/2015 0800      Component Value Date/Time   CALCIUM 9.7 01/23/2015 0800   ALKPHOS 66 01/23/2015 0800   AST 23 01/23/2015 0800   ALT 45 01/23/2015 0800   BILITOT 1.10 01/23/2015 0800     CLINICAL DATA: Restaging colon cancer diagnosed in 2007 and 2012. Chemotherapy and radiation therapy completed. Subsequent encounter.  EXAM: CT CHEST, ABDOMEN, AND PELVIS WITH CONTRAST  TECHNIQUE: Multidetector CT imaging of the chest, abdomen and pelvis was performed following the standard protocol during bolus administration of intravenous contrast.  CONTRAST: 186mL OMNIPAQUE IOHEXOL 300 MG/ML SOLN  COMPARISON: CTs 06/22/2014.  FINDINGS: Mediastinum/Nodes: There are no enlarged mediastinal, hilar or axillary lymph nodes. The thyroid gland, trachea and esophagus demonstrate no significant findings. The heart size is normal. There is no pericardial effusion. Chronic thrombosis of the brachiocephalic veins and SVC again noted with multiple collateral vessels within the anterior chest wall. No significant arterial abnormalities.  Lungs/Pleura: There is no pleural effusion.The lungs are clear.  Musculoskeletal/Chest wall: No chest wall mass or suspicious osseous findings.  CT ABDOMEN AND PELVIS FINDINGS  Hepatobiliary: Diffuse hepatic steatosis again noted. No focal lesions or abnormal enhancement identified. No evidence of gallstones, gallbladder wall thickening or biliary dilatation.  Pancreas: Unremarkable. No pancreatic ductal dilatation or surrounding inflammatory changes.  Spleen: Normal in size without focal abnormality.  Adrenals/Urinary Tract: Both adrenal glands appear normal. The kidneys appear normal without evidence of urinary tract calculus, suspicious lesion or  hydronephrosis. No bladder abnormalities are seen.  Stomach/Bowel: No evidence of bowel wall thickening, distention or surrounding inflammatory change. The sigmoid anastomosis appears unchanged.  Vascular/Lymphatic: There are no enlarged abdominal or pelvic lymph nodes. Collateral vessels again noted within the anterior abdominal wall. No significant atherosclerosis.  Reproductive: Unremarkable.  Other: There are stable small inguinal hernias containing fat. No ascites or peritoneal nodularity.  Musculoskeletal: No acute or significant osseous findings.  IMPRESSION: Stable CTs of the chest, abdomen and pelvis. No evidence of local recurrence or metastatic disease.  Stable incidental findings including diffuse hepatic steatosis and chronic SVC occlusion.   Impression and Plan:  54 year old gentleman with the following issues:  1. Colon cancer diagnosed in 2007.  He presented with a complete obstruction and presumably had stage III colon cancer. He had lymph node involvement and received initially 4 months of FOLFOX. He had tumor recurrence in 2012 and received surgical resection as well as 6 months of FOLFOX. He has not received any treatment since September 2012.  CT scan and laboratory testing done on 01/23/2015 were personally reviewed today and discussed with the patient. He continues to show no evidence of disease relapse at this time. I have recommended continued observation and surveillance and given his risk of relapse to repeat imaging studies in 6 months. I prefer to do CT scans  annually after September 2017.  2. Colonoscopy screening: This has not been done recently and I will send another referral for colonoscopy in the near future.  3. Follow-up: Will be in April 2017.   Zola Button, MD 10/21/201612:11 PM

## 2015-01-25 NOTE — Telephone Encounter (Signed)
per pof to sch pt appt-gave pt copy of avs-adv [facility] will call to sch scan °

## 2015-01-30 ENCOUNTER — Encounter: Payer: Self-pay | Admitting: Internal Medicine

## 2015-04-02 ENCOUNTER — Ambulatory Visit (AMBULATORY_SURGERY_CENTER): Payer: Self-pay | Admitting: *Deleted

## 2015-04-02 VITALS — Ht 70.0 in | Wt 258.0 lb

## 2015-04-02 DIAGNOSIS — Z85038 Personal history of other malignant neoplasm of large intestine: Secondary | ICD-10-CM

## 2015-04-02 MED ORDER — NA SULFATE-K SULFATE-MG SULF 17.5-3.13-1.6 GM/177ML PO SOLN: 1.0000 | Freq: Once | ORAL | Status: DC

## 2015-04-02 NOTE — Progress Notes (Signed)
No egg or soy allergy known to patient  No issues with past sedation with any surgeries  or procedures, no intubation problems-- 2007 with colon resection decreased sats and in sicu 24 hours postop  No diet pills per patient   No home 02 use per patient   emmi declined

## 2015-04-15 ENCOUNTER — Telehealth: Payer: Self-pay

## 2015-04-15 NOTE — Telephone Encounter (Signed)
Rescheduled procedure; will send new instructions

## 2015-04-16 ENCOUNTER — Encounter: Payer: BLUE CROSS/BLUE SHIELD | Admitting: Internal Medicine

## 2015-04-16 ENCOUNTER — Telehealth: Payer: Self-pay

## 2015-04-16 NOTE — Telephone Encounter (Signed)
Mailed new instructions reflecting the date change for patient's colonoscopy

## 2015-04-24 ENCOUNTER — Encounter: Payer: Self-pay | Admitting: Internal Medicine

## 2015-04-24 ENCOUNTER — Ambulatory Visit (AMBULATORY_SURGERY_CENTER): Payer: BLUE CROSS/BLUE SHIELD | Admitting: Internal Medicine

## 2015-04-24 VITALS — BP 125/78 | HR 78 | Temp 97.0°F | Resp 14 | Ht 70.0 in | Wt 258.0 lb

## 2015-04-24 DIAGNOSIS — Z85038 Personal history of other malignant neoplasm of large intestine: Secondary | ICD-10-CM | POA: Diagnosis present

## 2015-04-24 MED ORDER — SODIUM CHLORIDE 0.9 % IV SOLN: 500.0000 mL | INTRAVENOUS | Status: DC

## 2015-04-24 NOTE — Patient Instructions (Signed)
YOU HAD AN ENDOSCOPIC PROCEDURE TODAY AT THE [facility]:   Refer to the procedure report that was given to you for any specific questions about what was found during the examination.  If the procedure report does not answer your questions, please call your gastroenterologist to clarify.  If you requested that your care partner not be given the details of your procedure findings, then the procedure report has been included in a sealed envelope for you to review at your convenience later.  YOU SHOULD EXPECT: Some feelings of bloating in the abdomen. Passage of more gas than usual.  Walking can help get rid of the air that was put into your GI tract during the procedure and reduce the bloating. If you had a lower endoscopy (such as a colonoscopy or flexible sigmoidoscopy) you may notice spotting of blood in your stool or on the toilet paper. If you underwent a bowel prep for your procedure, you may not have a normal bowel movement for a few days.  Please Note:  You might notice some irritation and congestion in your nose or some drainage.  This is from the oxygen used during your procedure.  There is no need for concern and it should clear up in a day or so.  SYMPTOMS TO REPORT IMMEDIATELY:   Following lower endoscopy (colonoscopy or flexible sigmoidoscopy):  Excessive amounts of blood in the stool  Significant tenderness or worsening of abdominal pains  Swelling of the abdomen that is new, acute  Fever of 100F or higher   For urgent or emergent issues, a gastroenterologist can be reached at any hour by calling [phone].   DIET: Your first meal following the procedure should be a small meal and then it is ok to progress to your normal diet. Heavy or fried foods are harder to digest and may make you feel nauseous or bloated.  Likewise, meals heavy in dairy and vegetables can increase bloating.  Drink plenty of fluids but you should avoid alcoholic beverages for 24  hours.  ACTIVITY:  You should plan to take it easy for the rest of today and you should NOT DRIVE or use heavy machinery until tomorrow (because of the sedation medicines used during the test).    FOLLOW UP: Our staff will call the number listed on your records the next business day following your procedure to check on you and address any questions or concerns that you may have regarding the information given to you following your procedure. If we do not reach you, we will leave a message.  However, if you are feeling well and you are not experiencing any problems, there is no need to return our call.  We will assume that you have returned to your regular daily activities without incident.  If any biopsies were taken you will be contacted by phone or by letter within the next 1-3 weeks.  Please call us at [phone] if you have not heard about the biopsies in 3 weeks.    SIGNATURES/CONFIDENTIALITY: You and/or your care partner have signed paperwork which will be entered into your electronic medical record.  These signatures attest to the fact that that the information above on your After Visit Summary has been reviewed and is understood.  Full responsibility of the confidentiality of this discharge information lies with you and/or your care-partner.   Resume medications. 

## 2015-04-24 NOTE — Op Note (Signed)
[facility]  [address] [place], [address]   COLONOSCOPY PROCEDURE REPORT  PATIENT: James Buck, James Buck  MR#: [id number] BIRTHDATE: [birth year] , 55  yrs. old GENDER: male ENDOSCOPIST: Eustace Quail, MD REFERRED [id number] Alen Blew, M.D. PROCEDURE DATE:  04/24/2015 PROCEDURE:   Colonoscopy, surveillance First Screening Colonoscopy - Avg.  risk and is 50 yrs.  old or older - No.  Prior Negative Screening - Now for repeat screening. N/A  History of Adenoma - Now for follow-up colonoscopy & has been > or = to 3 yrs.  N/A  Polyps removed today? No Recommend repeat exam, <10 yrs? Yes high risk ASA CLASS:   Class II INDICATIONS:Surveillance due to prior colonic neoplasia and PH Colon or Rectal Adenocarcinoma.. Diagnosed with stage III sigmoid colon cancer in [facility] September 2007 status post sigmoid resection followed by adjuvant chemotherapy. Follow-up colonoscopy 2010 with diminutive polyp. Recurrent colon cancer extraintestinal 2012 requiring lymph node and partial small bowel resection (according to patient) followed by chemotherapy. Now felt to be disease-free. First exam since 2010. MEDICATIONS: Monitored anesthesia care and Propofol 300 mg IV  DESCRIPTION OF PROCEDURE:   After the risks benefits and alternatives of the procedure were thoroughly explained, informed consent was obtained.  The digital rectal exam revealed no abnormalities of the rectum.   The LB [id number]  endoscope was introduced through the anus and advanced to the cecum, which was identified by both the appendix and ileocecal valve. No adverse events experienced.   The quality of the prep was adequate (Suprep was used)  The instrument was then slowly withdrawn as the colon was fully examined. Estimated blood loss is zero unless otherwise noted in this procedure report.  COLON FINDINGS: There was evidence of a prior colo-colonic surgical anastomosis in the sigmoid colon at 20  cm.   The examination was otherwise normal.  Retroflexed views revealed internal hemorrhoids. The time to cecum = 1.9 Withdrawal time = 11.5   The scope was withdrawn and the procedure completed. COMPLICATIONS: There were no immediate complications.  ENDOSCOPIC IMPRESSION: 1.   There was evidence of a prior colo-colonic surgical anastomosis in the sigmoid colon 2.   The examination was otherwise normal  RECOMMENDATIONS: 1. Follow up colonoscopy in 5 years  eSigned:  Eustace Quail, MD 04/24/2015 12:08 PM   cc: The Patient, Zola Button, MD, and Milagros Evener, MD

## 2015-04-24 NOTE — Progress Notes (Signed)
Report to PACU, RN, vss, BBS= Clear.  

## 2015-04-25 ENCOUNTER — Telehealth: Payer: Self-pay

## 2015-04-25 NOTE — Telephone Encounter (Signed)
  Follow up Call-  Call back number 04/24/2015  Post procedure Call Back phone  # [phone]  Permission to leave phone message Yes     Patient was called for follow up after procedure on 04/24/2015. No answer at the number given. I was not able to leave a message because the recording said that the mail box was full.

## 2015-07-30 ENCOUNTER — Other Ambulatory Visit: Payer: BLUE CROSS/BLUE SHIELD

## 2015-07-31 DIAGNOSIS — H26053 Posterior subcapsular polar infantile and juvenile cataract, bilateral: Secondary | ICD-10-CM | POA: Diagnosis not present

## 2015-08-01 ENCOUNTER — Ambulatory Visit: Payer: BLUE CROSS/BLUE SHIELD | Admitting: Oncology

## 2015-08-06 ENCOUNTER — Other Ambulatory Visit: Payer: Self-pay | Admitting: *Deleted

## 2015-08-06 ENCOUNTER — Telehealth: Payer: Self-pay | Admitting: Oncology

## 2015-08-06 DIAGNOSIS — C801 Malignant (primary) neoplasm, unspecified: Secondary | ICD-10-CM

## 2015-08-06 DIAGNOSIS — E119 Type 2 diabetes mellitus without complications: Secondary | ICD-10-CM

## 2015-08-06 DIAGNOSIS — H25041 Posterior subcapsular polar age-related cataract, right eye: Secondary | ICD-10-CM | POA: Diagnosis not present

## 2015-08-06 NOTE — Telephone Encounter (Signed)
lvm for pt regarding to added lab on 5.8

## 2015-08-07 ENCOUNTER — Other Ambulatory Visit (HOSPITAL_BASED_OUTPATIENT_CLINIC_OR_DEPARTMENT_OTHER): Payer: BLUE CROSS/BLUE SHIELD

## 2015-08-07 DIAGNOSIS — C189 Malignant neoplasm of colon, unspecified: Secondary | ICD-10-CM | POA: Diagnosis not present

## 2015-08-07 DIAGNOSIS — Z85038 Personal history of other malignant neoplasm of large intestine: Secondary | ICD-10-CM | POA: Diagnosis not present

## 2015-08-07 DIAGNOSIS — C801 Malignant (primary) neoplasm, unspecified: Secondary | ICD-10-CM

## 2015-08-07 DIAGNOSIS — E119 Type 2 diabetes mellitus without complications: Secondary | ICD-10-CM

## 2015-08-07 LAB — COMPREHENSIVE METABOLIC PANEL
ALBUMIN: 4.1 g/dL (ref 3.5–5.0)
ALK PHOS: 55 U/L (ref 40–150)
ALT: 61 U/L — ABNORMAL HIGH (ref 0–55)
ANION GAP: 10 meq/L (ref 3–11)
AST: 41 U/L — AB (ref 5–34)
BUN: 13.5 mg/dL (ref 7.0–26.0)
CALCIUM: 9.5 mg/dL (ref 8.4–10.4)
CHLORIDE: 104 meq/L (ref 98–109)
CO2: 24 mEq/L (ref 22–29)
Creatinine: 1.1 mg/dL (ref 0.7–1.3)
EGFR: 76 mL/min/{1.73_m2} — AB (ref >90)
Glucose: 150 mg/dl — ABNORMAL HIGH (ref 70–140)
POTASSIUM: 4.4 meq/L (ref 3.5–5.1)
Sodium: 139 mEq/L (ref 136–145)
Total Bilirubin: 1.66 mg/dL — ABNORMAL HIGH (ref 0.20–1.20)
Total Protein: 6.9 g/dL (ref 6.4–8.3)

## 2015-08-07 LAB — CBC WITH DIFFERENTIAL/PLATELET
BASO%: 0.3 % (ref 0.0–2.0)
Basophils Absolute: 0 10*3/uL (ref 0.0–0.1)
EOS%: 4.5 % (ref 0.0–7.0)
Eosinophils Absolute: 0.3 10*3/uL (ref 0.0–0.5)
HCT: 48.1 % (ref 38.4–49.9)
HGB: 16.4 g/dL (ref 13.0–17.1)
LYMPH#: 1 10*3/uL (ref 0.9–3.3)
LYMPH%: 16.4 % (ref 14.0–49.0)
MCH: 32.2 pg (ref 27.2–33.4)
MCHC: 34.1 g/dL (ref 32.0–36.0)
MCV: 94.3 fL (ref 79.3–98.0)
MONO#: 0.6 10*3/uL (ref 0.1–0.9)
MONO%: 9.7 % (ref 0.0–14.0)
NEUT%: 69.1 % (ref 39.0–75.0)
NEUTROS ABS: 4.1 10*3/uL (ref 1.5–6.5)
PLATELETS: 233 10*3/uL (ref 140–400)
RBC: 5.1 10*6/uL (ref 4.20–5.82)
RDW: 13.3 % (ref 11.0–14.6)
WBC: 6 10*3/uL (ref 4.0–10.3)

## 2015-08-08 LAB — CEA: CEA: 2.4 ng/mL (ref 0.0–4.7)

## 2015-08-08 LAB — CEA (PARALLEL TESTING): CEA: 1.7 ng/mL

## 2015-08-12 ENCOUNTER — Other Ambulatory Visit: Payer: BLUE CROSS/BLUE SHIELD

## 2015-08-12 ENCOUNTER — Encounter (HOSPITAL_COMMUNITY): Payer: Self-pay

## 2015-08-12 ENCOUNTER — Ambulatory Visit (HOSPITAL_COMMUNITY)
Admission: RE | Admit: 2015-08-12 | Discharge: 2015-08-12 | Disposition: A | Discharge status: Home / Self Care | Payer: BLUE CROSS/BLUE SHIELD | Source: Ambulatory Visit | Attending: Oncology | Admitting: Oncology

## 2015-08-12 DIAGNOSIS — R911 Solitary pulmonary nodule: Secondary | ICD-10-CM | POA: Diagnosis not present

## 2015-08-12 DIAGNOSIS — C189 Malignant neoplasm of colon, unspecified: Secondary | ICD-10-CM | POA: Insufficient documentation

## 2015-08-12 DIAGNOSIS — I82210 Acute embolism and thrombosis of superior vena cava: Secondary | ICD-10-CM | POA: Diagnosis not present

## 2015-08-12 DIAGNOSIS — K760 Fatty (change of) liver, not elsewhere classified: Secondary | ICD-10-CM | POA: Insufficient documentation

## 2015-08-12 MED ORDER — IOPAMIDOL (ISOVUE-300) INJECTION 61%
100.0000 mL | Freq: Once | INTRAVENOUS | Status: AC | PRN
  Administered 2015-08-12: 100 mL via INTRAVENOUS

## 2015-08-22 DIAGNOSIS — H2511 Age-related nuclear cataract, right eye: Secondary | ICD-10-CM | POA: Diagnosis not present

## 2015-08-22 DIAGNOSIS — H25041 Posterior subcapsular polar age-related cataract, right eye: Secondary | ICD-10-CM | POA: Diagnosis not present

## 2015-09-12 DIAGNOSIS — H2512 Age-related nuclear cataract, left eye: Secondary | ICD-10-CM | POA: Diagnosis not present

## 2015-09-12 DIAGNOSIS — H25042 Posterior subcapsular polar age-related cataract, left eye: Secondary | ICD-10-CM | POA: Diagnosis not present

## 2015-10-24 DIAGNOSIS — G4733 Obstructive sleep apnea (adult) (pediatric): Secondary | ICD-10-CM | POA: Diagnosis not present

## 2016-01-31 DIAGNOSIS — G4733 Obstructive sleep apnea (adult) (pediatric): Secondary | ICD-10-CM | POA: Diagnosis not present

## 2016-05-01 DIAGNOSIS — Z23 Encounter for immunization: Secondary | ICD-10-CM | POA: Diagnosis not present

## 2016-05-01 DIAGNOSIS — Z79899 Other long term (current) drug therapy: Secondary | ICD-10-CM | POA: Diagnosis not present

## 2016-05-01 DIAGNOSIS — E1136 Type 2 diabetes mellitus with diabetic cataract: Secondary | ICD-10-CM | POA: Diagnosis not present

## 2016-05-01 DIAGNOSIS — Z5181 Encounter for therapeutic drug level monitoring: Secondary | ICD-10-CM | POA: Diagnosis not present

## 2016-05-05 DIAGNOSIS — G4733 Obstructive sleep apnea (adult) (pediatric): Secondary | ICD-10-CM | POA: Diagnosis not present

## 2016-05-15 DIAGNOSIS — E1165 Type 2 diabetes mellitus with hyperglycemia: Secondary | ICD-10-CM | POA: Diagnosis not present

## 2016-05-15 DIAGNOSIS — E1136 Type 2 diabetes mellitus with diabetic cataract: Secondary | ICD-10-CM | POA: Diagnosis not present

## 2016-06-23 DIAGNOSIS — M79604 Pain in right leg: Secondary | ICD-10-CM | POA: Diagnosis not present

## 2016-07-03 DIAGNOSIS — F330 Major depressive disorder, recurrent, mild: Secondary | ICD-10-CM | POA: Diagnosis not present

## 2016-07-08 DIAGNOSIS — F330 Major depressive disorder, recurrent, mild: Secondary | ICD-10-CM | POA: Diagnosis not present

## 2016-07-15 DIAGNOSIS — F330 Major depressive disorder, recurrent, mild: Secondary | ICD-10-CM | POA: Diagnosis not present

## 2016-07-22 ENCOUNTER — Ambulatory Visit (INDEPENDENT_AMBULATORY_CARE_PROVIDER_SITE_OTHER): Payer: Self-pay | Admitting: Orthopedic Surgery

## 2016-07-23 DIAGNOSIS — F330 Major depressive disorder, recurrent, mild: Secondary | ICD-10-CM | POA: Diagnosis not present

## 2016-07-29 DIAGNOSIS — F330 Major depressive disorder, recurrent, mild: Secondary | ICD-10-CM | POA: Diagnosis not present

## 2016-08-05 DIAGNOSIS — F330 Major depressive disorder, recurrent, mild: Secondary | ICD-10-CM | POA: Diagnosis not present

## 2016-08-12 DIAGNOSIS — F330 Major depressive disorder, recurrent, mild: Secondary | ICD-10-CM | POA: Diagnosis not present

## 2016-08-13 DIAGNOSIS — E1136 Type 2 diabetes mellitus with diabetic cataract: Secondary | ICD-10-CM | POA: Diagnosis not present

## 2016-08-13 DIAGNOSIS — E1165 Type 2 diabetes mellitus with hyperglycemia: Secondary | ICD-10-CM | POA: Diagnosis not present

## 2016-08-19 DIAGNOSIS — F330 Major depressive disorder, recurrent, mild: Secondary | ICD-10-CM | POA: Diagnosis not present

## 2016-08-19 DIAGNOSIS — E1169 Type 2 diabetes mellitus with other specified complication: Secondary | ICD-10-CM | POA: Diagnosis not present

## 2016-08-19 DIAGNOSIS — Z85038 Personal history of other malignant neoplasm of large intestine: Secondary | ICD-10-CM | POA: Diagnosis not present

## 2016-08-19 DIAGNOSIS — E669 Obesity, unspecified: Secondary | ICD-10-CM | POA: Diagnosis not present

## 2016-08-20 ENCOUNTER — Other Ambulatory Visit (HOSPITAL_COMMUNITY): Payer: Self-pay | Admitting: General Surgery

## 2016-08-25 ENCOUNTER — Encounter: Payer: Self-pay | Admitting: Registered"

## 2016-08-25 ENCOUNTER — Encounter: Payer: BLUE CROSS/BLUE SHIELD | Attending: General Surgery | Admitting: Registered"

## 2016-08-25 DIAGNOSIS — Z9049 Acquired absence of other specified parts of digestive tract: Secondary | ICD-10-CM | POA: Insufficient documentation

## 2016-08-25 DIAGNOSIS — Z713 Dietary counseling and surveillance: Secondary | ICD-10-CM | POA: Insufficient documentation

## 2016-08-25 DIAGNOSIS — Z85038 Personal history of other malignant neoplasm of large intestine: Secondary | ICD-10-CM | POA: Diagnosis not present

## 2016-08-25 DIAGNOSIS — E119 Type 2 diabetes mellitus without complications: Secondary | ICD-10-CM | POA: Diagnosis not present

## 2016-08-25 NOTE — Progress Notes (Signed)
Pre-Op Assessment Visit:  Pre-Operative Sleeve Gastrectomy Surgery  Medical Nutrition Therapy:  Appt start time: 8:30  End time:  9:26  Patient was seen on 08/25/2016 for Pre-Operative Nutrition Assessment. Assessment and letter of approval faxed to [facility] Bariatric Surgery Program coordinator on 08/25/2016.   Pt expectation of surgery: lose weight and bring control diabetes  Pt expectation of Dietitian: surgery-specific things   Start weight at [facility]: 249.5 BMI: 35.80   Pt states he has had 9" of color removed due to having colon cancer 2x.Pt states he had colonoscopy in Feb 2008 and lymph node was removed, along with a small piece of small intestine where lymph node was attached. Pt states he's currently having leg pain limiting his physical activity. Pt states he is starting school in fall at [employer] for English degree. Pt states he has had diabetes since in 2007. Pt states he checks BS 1-2x/day: FBS (130-150) Midday (150). Pt states his last A1c was a few wks ago of 7.7, a decrease from 8.1. Pt states he doesn't experience low BS.   Per insurance, pt needs 4 SWL visits prior to surgery.    24 hr Dietary Recall: First Meal: sausage, egg, cheese english muffin Snack: Atkins meal bar Second Meal: leftovers  Snack: apple or nuts  Third Meal: chicken, potatoes w/ skin or pasta, broccoli or cabbage  Snack: popcorn Beverages: water, coffee, diet soda (occasionally), sweet tea (occasionally)  Encouraged to engage in 150 minutes of moderate physical activity including cardiovascular and weight baring weekly  Handouts given during visit include:  . Pre-Op Goals . Bariatric Surgery Protein Shakes . Vitamin and Mineral Recommendations  During the appointment today the following Pre-Op Goals were reviewed with the patient: . Maintain or lose weight as instructed by your surgeon . Make healthy food choices . Begin to limit portion sizes . Limited concentrated sugars and  fried foods . Keep fat/sugar in the single digits per serving on          food labels . Practice CHEWING your food  (aim for 30 chews per bite or until applesauce consistency) . Practice not drinking 15 minutes before, during, and 30 minutes after each meal/snack . Avoid all carbonated beverages  . Avoid/limit caffeinated beverages  . Avoid all sugar-sweetened beverages . Consume 3 meals per day; eat every 3-5 hours . Make a list of non-food related activities . Aim for 64-100 ounces of FLUID daily  . Aim for at least 60-80 grams of PROTEIN daily . Look for a liquid protein source that contain ?15 g protein and ?5 g carbohydrate  (ex: shakes, drinks, shots) . Physical activity is an important part of a healthy lifestyle so keep it moving!  Follow diet recommendations listed below Energy and Macronutrient Recommendations: Calories: 1800 Carbohydrate: 200 Protein: 135 Fat: 50  Demonstrated degree of understanding via:  Teach Back   Teaching Method Utilized:  Visual Auditory Hands on  Barriers to learning/adherence to lifestyle change: none  Patient to call the [facility] to enroll in Pre-Op and Post-Op Nutrition Education when surgery date is scheduled.

## 2016-08-26 DIAGNOSIS — F330 Major depressive disorder, recurrent, mild: Secondary | ICD-10-CM | POA: Diagnosis not present

## 2016-08-28 DIAGNOSIS — F509 Eating disorder, unspecified: Secondary | ICD-10-CM | POA: Diagnosis not present

## 2016-09-02 DIAGNOSIS — F330 Major depressive disorder, recurrent, mild: Secondary | ICD-10-CM | POA: Diagnosis not present

## 2016-09-09 DIAGNOSIS — F330 Major depressive disorder, recurrent, mild: Secondary | ICD-10-CM | POA: Diagnosis not present

## 2016-09-15 ENCOUNTER — Ambulatory Visit (HOSPITAL_COMMUNITY)
Admission: RE | Admit: 2016-09-15 | Discharge: 2016-09-15 | Disposition: A | Discharge status: Home / Self Care | Payer: BLUE CROSS/BLUE SHIELD | Source: Ambulatory Visit | Attending: General Surgery | Admitting: General Surgery

## 2016-09-15 ENCOUNTER — Other Ambulatory Visit: Payer: Self-pay

## 2016-09-15 DIAGNOSIS — Z6835 Body mass index (BMI) 35.0-35.9, adult: Secondary | ICD-10-CM | POA: Diagnosis not present

## 2016-09-15 DIAGNOSIS — Z01810 Encounter for preprocedural cardiovascular examination: Secondary | ICD-10-CM | POA: Insufficient documentation

## 2016-09-15 DIAGNOSIS — Z01818 Encounter for other preprocedural examination: Secondary | ICD-10-CM | POA: Diagnosis not present

## 2016-09-15 DIAGNOSIS — F330 Major depressive disorder, recurrent, mild: Secondary | ICD-10-CM | POA: Diagnosis not present

## 2016-09-15 DIAGNOSIS — C189 Malignant neoplasm of colon, unspecified: Secondary | ICD-10-CM | POA: Diagnosis not present

## 2016-09-23 DIAGNOSIS — F330 Major depressive disorder, recurrent, mild: Secondary | ICD-10-CM | POA: Diagnosis not present

## 2016-09-24 ENCOUNTER — Ambulatory Visit: Payer: BLUE CROSS/BLUE SHIELD | Admitting: Registered"

## 2016-09-30 DIAGNOSIS — F330 Major depressive disorder, recurrent, mild: Secondary | ICD-10-CM | POA: Diagnosis not present

## 2016-10-01 ENCOUNTER — Encounter: Payer: BLUE CROSS/BLUE SHIELD | Attending: General Surgery | Admitting: Registered"

## 2016-10-01 ENCOUNTER — Encounter: Payer: Self-pay | Admitting: Registered"

## 2016-10-01 DIAGNOSIS — Z85038 Personal history of other malignant neoplasm of large intestine: Secondary | ICD-10-CM | POA: Diagnosis not present

## 2016-10-01 DIAGNOSIS — Z713 Dietary counseling and surveillance: Secondary | ICD-10-CM | POA: Insufficient documentation

## 2016-10-01 DIAGNOSIS — E119 Type 2 diabetes mellitus without complications: Secondary | ICD-10-CM | POA: Insufficient documentation

## 2016-10-01 DIAGNOSIS — Z9049 Acquired absence of other specified parts of digestive tract: Secondary | ICD-10-CM | POA: Insufficient documentation

## 2016-10-01 DIAGNOSIS — E669 Obesity, unspecified: Secondary | ICD-10-CM

## 2016-10-01 NOTE — Patient Instructions (Addendum)
-   Continue to work on chewing at least 30 times per bite or to applesauce consistency.   - Continue to work on not drinking 15 min before eating, not while eating, and waiting 30 min after eating to drink.   - [place] about personal trainer opportunity.  - Increase physical activity with walking (in the morning) at least 30 min/day, 3 days/week.

## 2016-10-01 NOTE — Progress Notes (Signed)
Appt start time: 11:15 end time: 11:39  Assessment: 1st SWL Appointment.   Start Wt at [facility]: 249.5 Wt: 246.8 BMI: 35.41   Pt arrives having lost 2.7 lbs from previous visit. Pt states checking BS 2x/day: FBS (150) and after meals (158-180). Pt states he has not been working on chewing well or not drinking with meals. Pt states drinking with meals will be the most challenging habit to accomplish. Pt states sometimes he enjoys "the crunch to go with sandwich for lunch"; eats chips at times. Pt reports he has reduced the amount of eating out. 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 Pt states he has been interested in [place] Diet to help with lowering blood sugar numbers.     MEDICATIONS: See list   DIETARY INTAKE:  24-hr recall:  First Meal: sausage, egg, cheese english muffin Snack: Atkins protein bar Second Meal: leftovers or sandwich (uses double fiber bread), sometimes chips  Snack: apple or nuts or 1-2 pretzel sticks Third Meal: pork or chicken, potatoes w/ skin or pasta, broccoli or cabbage  Snack: popcorn Beverages: water, coffee, diet soda (occasionally), sweet tea (occasionally)  Usual physical activity: none  Diet to Follow: Calories: 1800 kcals Carbohydrate: 200g Protein: 135g Fat: 50g  Preferred Learning Style:   No preference indicated   Learning Readiness:   Contemplating  Ready  Change in progress     Nutritional Diagnosis:  [place]-3.3 Overweight/obesity related to past poor dietary habits and physical inactivity as evidenced by patient w/ planned sleeve gastrectomy surgery following dietary guidelines for continued weight loss.    Intervention:  Nutrition counseling for upcoming Bariatric Surgery.  Goals:  - Continue to work on chewing at least 30 times per bite or to applesauce consistency.  - Continue to work on not drinking 15  min before eating, not while eating, and waiting 30 min after eating to drink.  - [place] about personal trainer opportunity. - Increase physical activity with walking (in the morning) at least 30 min/day, 3 days/week.  Teaching Method Utilized:  Visual Auditory  Handouts given during visit include:  Bake, Broil, Grill  Mediterranean Diet  Barriers to learning/adherence to lifestyle change: none  Demonstrated degree of understanding via:  Teach Back   Monitoring/Evaluation:  Dietary intake, exercise, and body weight in 1 month(s).

## 2016-10-21 DIAGNOSIS — F330 Major depressive disorder, recurrent, mild: Secondary | ICD-10-CM | POA: Diagnosis not present

## 2016-10-29 DIAGNOSIS — E1136 Type 2 diabetes mellitus with diabetic cataract: Secondary | ICD-10-CM | POA: Diagnosis not present

## 2016-10-29 DIAGNOSIS — E1165 Type 2 diabetes mellitus with hyperglycemia: Secondary | ICD-10-CM | POA: Diagnosis not present

## 2016-11-02 ENCOUNTER — Encounter: Payer: Self-pay | Admitting: Registered"

## 2016-11-02 ENCOUNTER — Encounter: Payer: BLUE CROSS/BLUE SHIELD | Attending: General Surgery | Admitting: Registered"

## 2016-11-02 DIAGNOSIS — Z85038 Personal history of other malignant neoplasm of large intestine: Secondary | ICD-10-CM | POA: Diagnosis not present

## 2016-11-02 DIAGNOSIS — Z9049 Acquired absence of other specified parts of digestive tract: Secondary | ICD-10-CM | POA: Insufficient documentation

## 2016-11-02 DIAGNOSIS — Z713 Dietary counseling and surveillance: Secondary | ICD-10-CM | POA: Diagnosis not present

## 2016-11-02 DIAGNOSIS — E119 Type 2 diabetes mellitus without complications: Secondary | ICD-10-CM | POA: Diagnosis not present

## 2016-11-02 NOTE — Progress Notes (Signed)
Appt start time: 12:04 end time: 12:28  Assessment: 2nd SWL Appointment.   Start Wt at [facility]: 249.5 Wt: 244.3 BMI: 35.05   Pt arrives having lost 2.5 lbs from previous visit. Pt states he has been working on chewing. Pt reports trying the Mediterranean diet for 1 week and caused BS to increase to average of 181. Pt reports seeing his endocrinologist last week and results show that BS averages 160. Pt states he checks BS 2x/day: FBS  and after meals; has alerts set on phone to check routinely. Pt states he is aware of listening to his body due to previous post-op procedures. Pt states school begins 8/14 and he has access to personal trainer at [facility]; still needs to submit paperwork. Pt reports due to warmer weather it has been harder to do physical activity. Pt reports experimenting with healthier options and trying new recipes, along with watching portion control.    MEDICATIONS: See list   DIETARY INTAKE:  24-hr recall:  First Meal: sausage, egg, cheese english muffin or chicken biscuit Snack: Atkins protein bar Second Meal: leftovers or sandwich (uses double fiber bread), sometimes chips or salads (spring mix, tomatoes, frozen chicken strips) Snack: apple or nuts or 1-2 pretzel sticks Third Meal: pork or chicken, potatoes w/ skin or pasta, broccoli or cabbage Snack: popcorn or popsicle or bar Beverages: water, coffee, diet soda (socially), sweet tea (occasionally)   Usual physical activity: some walking  Diet to Follow: Calories: 1800 kcals Carbohydrate: 200g Protein: 135g Fat: 50g  Preferred Learning Style:   No preference indicated   Learning Readiness:   Contemplating  Ready  Change in progress     Nutritional Diagnosis:  [place]-3.3 Overweight/obesity related to past poor dietary habits and physical inactivity as evidenced by patient w/ planned sleeve gastrectomy surgery following dietary guidelines for continued weight loss.    Intervention:   Nutrition counseling for upcoming Bariatric Surgery.  Goals:  - Continue to work on not drinking 15 min before eating, not while eating, and waiting 30 min after eating to drink.  - Create physical activity routine with walking and/or strength training at least 30 min/day, 3 days/week.  Teaching Method Utilized:  Visual Auditory  Handouts given during visit include:  none  Barriers to learning/adherence to lifestyle change: none  Demonstrated degree of understanding via:  Teach Back   Monitoring/Evaluation:  Dietary intake, exercise, and body weight in 1 month(s).

## 2016-11-02 NOTE — Patient Instructions (Addendum)
-   Continue to work on not drinking 15 min before eating, not while eating, and waiting 30 min after eating to drink.   - Create physical activity routine with walking and/or strength training at least 30 min/day, 3 days/week.

## 2016-11-09 DIAGNOSIS — F330 Major depressive disorder, recurrent, mild: Secondary | ICD-10-CM | POA: Diagnosis not present

## 2016-11-26 DIAGNOSIS — G4733 Obstructive sleep apnea (adult) (pediatric): Secondary | ICD-10-CM | POA: Diagnosis not present

## 2016-11-30 ENCOUNTER — Encounter: Payer: BLUE CROSS/BLUE SHIELD | Attending: General Surgery | Admitting: Registered"

## 2016-11-30 ENCOUNTER — Encounter: Payer: Self-pay | Admitting: Registered"

## 2016-11-30 DIAGNOSIS — Z713 Dietary counseling and surveillance: Secondary | ICD-10-CM | POA: Insufficient documentation

## 2016-11-30 DIAGNOSIS — Z9049 Acquired absence of other specified parts of digestive tract: Secondary | ICD-10-CM | POA: Diagnosis not present

## 2016-11-30 DIAGNOSIS — Z85038 Personal history of other malignant neoplasm of large intestine: Secondary | ICD-10-CM | POA: Diagnosis not present

## 2016-11-30 DIAGNOSIS — E119 Type 2 diabetes mellitus without complications: Secondary | ICD-10-CM | POA: Insufficient documentation

## 2016-11-30 NOTE — Progress Notes (Signed)
Appt start time: 4:00 end time: 4:20  Assessment: 3rd SWL Appointment.   Start Wt at [facility]: 249.5 Wt: 248.3 BMI: 35.63   Pt arrives having gained 4 lbs from previous visit. Pt states he checks BS 2x/day: FBS (130-150) and after meals (180-200); has alerts set on phone to check routinely. Pt states he has a new phone and needs to set alerts to be more consistent. Pt states she is working on chewing, still having to count but it is getting better. Pt states he is working on not drinking with meals.Pt states he is getting at least 64 oz of fluid a day.   MEDICATIONS: See list   DIETARY INTAKE:  24-hr recall:  First Meal: sausage, egg, cheese english muffin or chicken biscuit Snack: Atkins protein bar Second Meal: leftovers or sandwich (uses double fiber bread), sometimes chips or salads (spring mix, tomatoes, frozen chicken strips) Snack: apple or nuts or 1-2 pretzel sticks Third Meal: pork or chicken, potatoes w/ skin or pasta, broccoli or cabbage Snack: popcorn or popsicle or bar Beverages: water, coffee, diet soda (socially), sweet tea (occasionally)   Usual physical activity: walking across campus  Diet to Follow: Calories: 1800 kcals Carbohydrate: 200g Protein: 135g Fat: 50g  Preferred Learning Style:   No preference indicated   Learning Readiness:   Contemplating  Ready  Change in progress     Nutritional Diagnosis:  [place]-3.3 Overweight/obesity related to past poor dietary habits and physical inactivity as evidenced by patient w/ planned sleeve gastrectomy surgery following dietary guidelines for continued weight loss.    Intervention:  Nutrition counseling for upcoming Bariatric Surgery.  Goals:  - Reduce caffeine intake to no more than 2 cups/day.  - Look for a liquid protein source that contain ?15 g protein and ?5 g carbohydrate  (ex: shakes, drinks, shots). - Track to ensure that you are getting at least 64 oz of fluid/day.   Teaching Method Utilized:   Visual Auditory  Handouts given during visit include:  Protein Shakes  Barriers to learning/adherence to lifestyle change: none  Demonstrated degree of understanding via:  Teach Back   Monitoring/Evaluation:  Dietary intake, exercise, and body weight in 1 month(s).

## 2016-11-30 NOTE — Patient Instructions (Addendum)
-   Reduce caffeine intake to no more than 2 cups/day.   - Look for a liquid protein source that contain ?15 g protein and ?5 g carbohydrate  (ex: shakes, drinks, shots).  - Track to ensure that you are getting at least 64 oz of fluid/day.

## 2016-12-25 ENCOUNTER — Ambulatory Visit (HOSPITAL_BASED_OUTPATIENT_CLINIC_OR_DEPARTMENT_OTHER): Payer: BLUE CROSS/BLUE SHIELD

## 2016-12-25 ENCOUNTER — Telehealth: Payer: Self-pay | Admitting: Oncology

## 2016-12-25 ENCOUNTER — Ambulatory Visit (HOSPITAL_BASED_OUTPATIENT_CLINIC_OR_DEPARTMENT_OTHER): Payer: BLUE CROSS/BLUE SHIELD | Admitting: Oncology

## 2016-12-25 VITALS — BP 134/77 | HR 96 | Temp 97.6°F | Resp 18 | Ht 70.0 in | Wt 246.1 lb

## 2016-12-25 DIAGNOSIS — C801 Malignant (primary) neoplasm, unspecified: Secondary | ICD-10-CM

## 2016-12-25 DIAGNOSIS — Z85038 Personal history of other malignant neoplasm of large intestine: Secondary | ICD-10-CM

## 2016-12-25 DIAGNOSIS — R194 Change in bowel habit: Secondary | ICD-10-CM | POA: Diagnosis not present

## 2016-12-25 DIAGNOSIS — K5900 Constipation, unspecified: Secondary | ICD-10-CM | POA: Diagnosis not present

## 2016-12-25 LAB — COMPREHENSIVE METABOLIC PANEL
ALBUMIN: 4 g/dL (ref 3.5–5.0)
ALT: 34 U/L (ref 0–55)
ANION GAP: 13 meq/L — AB (ref 3–11)
AST: 20 U/L (ref 5–34)
Alkaline Phosphatase: 80 U/L (ref 40–150)
BUN: 15.3 mg/dL (ref 7.0–26.0)
CALCIUM: 9.6 mg/dL (ref 8.4–10.4)
CO2: 22 mEq/L (ref 22–29)
CREATININE: 1.1 mg/dL (ref 0.7–1.3)
Chloride: 104 mEq/L (ref 98–109)
EGFR: 76 mL/min/{1.73_m2} — AB (ref >90)
Glucose: 158 mg/dl — ABNORMAL HIGH (ref 70–140)
Potassium: 4.3 mEq/L (ref 3.5–5.1)
Sodium: 139 mEq/L (ref 136–145)
TOTAL PROTEIN: 7.1 g/dL (ref 6.4–8.3)
Total Bilirubin: 1.21 mg/dL — ABNORMAL HIGH (ref 0.20–1.20)

## 2016-12-25 LAB — CBC WITH DIFFERENTIAL/PLATELET
BASO%: 0.7 % (ref 0.0–2.0)
Basophils Absolute: 0.1 10*3/uL (ref 0.0–0.1)
EOS ABS: 0.3 10*3/uL (ref 0.0–0.5)
EOS%: 3.6 % (ref 0.0–7.0)
HEMATOCRIT: 48.1 % (ref 38.4–49.9)
HEMOGLOBIN: 16.3 g/dL (ref 13.0–17.1)
LYMPH#: 1.2 10*3/uL (ref 0.9–3.3)
LYMPH%: 17.5 % (ref 14.0–49.0)
MCH: 32 pg (ref 27.2–33.4)
MCHC: 33.9 g/dL (ref 32.0–36.0)
MCV: 94.5 fL (ref 79.3–98.0)
MONO#: 0.6 10*3/uL (ref 0.1–0.9)
MONO%: 8.4 % (ref 0.0–14.0)
NEUT%: 69.8 % (ref 39.0–75.0)
NEUTROS ABS: 4.9 10*3/uL (ref 1.5–6.5)
PLATELETS: 235 10*3/uL (ref 140–400)
RBC: 5.09 10*6/uL (ref 4.20–5.82)
RDW: 13.2 % (ref 11.0–14.6)
WBC: 7 10*3/uL (ref 4.0–10.3)

## 2016-12-25 LAB — CEA (IN HOUSE-CHCC): CEA (CHCC-In House): 2.67 ng/mL (ref 0.00–5.00)

## 2016-12-25 NOTE — Telephone Encounter (Signed)
Scheduled appt per 9/21 los - Gave patient AVS and calender per los.  

## 2016-12-25 NOTE — Progress Notes (Signed)
Hematology and Oncology Follow Up Visit  James Buck [id number] Jul 25, 1960 56 y.o. 12/25/2016 12:01 PM James Buck, [place], [name], MD   Principle Diagnosis:  56 year old gentleman diagnosed with colon cancer in 2007. He had an obstructive mass and found to have stage III colon cancer. He subsequently developed stage IV disease in 2012. He has been an [facility] since that time.   Prior Therapy:   He is status post surgical resection 1 2007 followed by 6 months of FOLFOX. He developed recurrence in 2012 and underwent surgical resection and another 6 months of FOLFOX completed in September 2012. He had been disease-free since that time.  Current therapy:  Observation and surveillance.  Interim History:  James Buck presents today for a follow-up visit. Since the last visit, he reports issues with changes in his bowel habits. Reports constipation which is resolving slowly. He tried Senokot with some improvement. He denied any abdominal pain, nausea or hematochezia. He remains active attending to activities of daily living. He denied any respiratory symptoms or any dyspnea on exertion.  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  He is not report any headaches, blurry vision, double vision or seizures.  He does not report any fevers, chills, sweats or weight loss. He does not report any chest pain, palpitation, orthopnea, leg edema. He does not report any cough, wheezing or shortness of breath. He does not report any hemoptysis or hematemesis.  He does not report any frequency, urgency, hesitancy. Does not report any abdominal distention or early satiety. He does not report any skeletal complaints. Rest of his review of systems unremarkable.   Medications: I have reviewed the patient's current medications.  Current Outpatient Prescriptions  Medication Sig Dispense Refill  . aspirin 81 MG tablet Take 81 mg by mouth daily.    .  Canagliflozin-Metformin HCl (INVOKAMET) [phone] MG TABS Take 1 each by mouth 2 (two) times daily.    . Cholecalciferol (VITAMIN D3) 5000 UNITS CAPS Take by mouth. Reported on 04/24/2015    . Cyanocobalamin (VITAMIN B-12) 500 MCG SUBL Place 500 mcg under the tongue daily. Reported on 04/24/2015    . Dulaglutide (TRULICITY) 2.35 TD/3.2KG SOPN Inject 1 each into the skin every 7 (seven) days.    . Empagliflozin-Metformin HCl (SYNJARDY) 12.08-998 MG TABS Take by mouth.    . fluticasone (FLONASE) 50 MCG/ACT nasal spray Place 2 sprays into the nose daily. Reported on 04/24/2015    . glucose blood test strip 1 each. Use as instructed. Check cbg q am, alternatinf between AM and PM mels. AC   QID     No current facility-administered medications for this visit.      Allergies:  Allergies  Allergen Reactions  . Statins Other (See Comments)    Body aches  . Victoza [Liraglutide] Nausea Only    Prior nausea when taken in AM (tolerates bedtime dosing with no difficulty)    Past Medical History, Surgical history, Social history, and Family History were reviewed and updated.   Physical Exam: Blood pressure 134/77, pulse 96, temperature 97.6 F (36.4 C), temperature source Oral, resp. rate 18, height 5\' 10"  (1.778 m), weight 246 lb 1.6 oz (111.6 kg), SpO2 96 %. ECOG:  0 General appearance: Well-appearing gentleman without distress. Head: Normocephalic, without obvious abnormality no oral ulcers or lesions. Neck: no adenopathy Lymph nodes: Cervical, supraclavicular, and axillary nodes normal. Heart:regular rate and rhythm, S1, S2 normal, no murmur, click, rub  or gallop Lung:chest clear, no wheezing, rales, normal symmetric air entry Abdomin: soft, non-tender, without masses or organomegaly no shifting dullness or ascites. EXT:no erythema, induration, or nodules   Lab Results: Lab Results  Component Value Date   WBC 6.0 08/07/2015   HGB 16.4 08/07/2015   HCT 48.1 08/07/2015   MCV 94.3  08/07/2015   PLT 233 08/07/2015     Chemistry      Component Value Date/Time   NA 139 08/07/2015 1028   K 4.4 08/07/2015 1028   CO2 24 08/07/2015 1028   BUN 13.5 08/07/2015 1028   CREATININE 1.1 08/07/2015 1028      Component Value Date/Time   CALCIUM 9.5 08/07/2015 1028   ALKPHOS 55 08/07/2015 1028   AST 41 (H) 08/07/2015 1028   ALT 61 (H) 08/07/2015 1028   BILITOT 1.66 (H) 08/07/2015 1028      Impression and Plan:  56 year old gentleman with the following issues:  1. Colon cancer diagnosed in 2007.  He presented with a complete obstruction and presumably had stage III colon cancer. He had lymph node involvement and received initially 4 months of FOLFOX. He had tumor recurrence in 2012 and received surgical resection as well as 6 months of FOLFOX. He has not received any treatment since September 2012.  CT scan and laboratory testing done on 01/23/2015 showed no evidence of disease relapse at this time.   He is developing more GI symptoms including constipation and mild discomfort. We will obtain imaging studies for staging purposes to rule out cancer recurrence. I have asked him to use stool softeners and MiraLAX to combat his constipation.  2. Colonoscopy screening: This was completed last 2 years and he is up-to-date.  3. Follow-up: Will be in 6 months sooner if his scan showed any evidence of recurrence.   James Button, MD 9/21/201812:01 PM

## 2016-12-30 ENCOUNTER — Ambulatory Visit: Payer: BLUE CROSS/BLUE SHIELD | Admitting: Registered"

## 2017-01-12 ENCOUNTER — Ambulatory Visit (HOSPITAL_COMMUNITY)
Admission: RE | Admit: 2017-01-12 | Discharge: 2017-01-12 | Disposition: A | Discharge status: Home / Self Care | Payer: BLUE CROSS/BLUE SHIELD | Source: Ambulatory Visit | Attending: Oncology | Admitting: Oncology

## 2017-01-12 ENCOUNTER — Encounter (HOSPITAL_COMMUNITY): Payer: Self-pay

## 2017-01-12 ENCOUNTER — Encounter: Payer: Self-pay | Admitting: Registered"

## 2017-01-12 ENCOUNTER — Encounter: Payer: BLUE CROSS/BLUE SHIELD | Attending: General Surgery | Admitting: Registered"

## 2017-01-12 DIAGNOSIS — Z9049 Acquired absence of other specified parts of digestive tract: Secondary | ICD-10-CM | POA: Insufficient documentation

## 2017-01-12 DIAGNOSIS — E119 Type 2 diabetes mellitus without complications: Secondary | ICD-10-CM | POA: Insufficient documentation

## 2017-01-12 DIAGNOSIS — I82211 Chronic embolism and thrombosis of superior vena cava: Secondary | ICD-10-CM | POA: Diagnosis not present

## 2017-01-12 DIAGNOSIS — Z85038 Personal history of other malignant neoplasm of large intestine: Secondary | ICD-10-CM | POA: Diagnosis not present

## 2017-01-12 DIAGNOSIS — Z713 Dietary counseling and surveillance: Secondary | ICD-10-CM | POA: Diagnosis not present

## 2017-01-12 DIAGNOSIS — C801 Malignant (primary) neoplasm, unspecified: Secondary | ICD-10-CM | POA: Insufficient documentation

## 2017-01-12 DIAGNOSIS — I2510 Atherosclerotic heart disease of native coronary artery without angina pectoris: Secondary | ICD-10-CM | POA: Diagnosis not present

## 2017-01-12 DIAGNOSIS — C189 Malignant neoplasm of colon, unspecified: Secondary | ICD-10-CM | POA: Diagnosis not present

## 2017-01-12 DIAGNOSIS — I700 Atherosclerosis of aorta: Secondary | ICD-10-CM | POA: Insufficient documentation

## 2017-01-12 MED ORDER — IOPAMIDOL (ISOVUE-300) INJECTION 61%
100.0000 mL | Freq: Once | INTRAVENOUS | Status: AC | PRN
  Administered 2017-01-12: 100 mL via INTRAVENOUS

## 2017-01-12 MED ORDER — IOPAMIDOL (ISOVUE-300) INJECTION 61%
INTRAVENOUS | Status: AC
  Filled 2017-01-12: qty 100

## 2017-01-12 NOTE — Patient Instructions (Addendum)
-   Take Multivitamin Complete with iron and Vitamin D supplement 5000 IU.   - Continue to work on not drinking with meals. Try not to have drinks on the table when eating.   - Continue to increase physical activity to at least 30 min, 5 days/week.

## 2017-01-12 NOTE — Progress Notes (Signed)
Appt start time: 10:25 end time: 10:45  Assessment: 4th SWL Appointment.   Start Wt at [facility]: 249.5 Wt: 249.1 BMI: 35.74    Pt arrives having maintained weight from previous visit. Pt states he checks BS 2x/day and numbers are approximately the same as previous visit: FBS (130-150) and after meals (180-200). Pt states he has decreased caffeine intake from 3 cups of coffee to 2 cups of coffee per day. Pt states he likes vanilla and chocolate Premier protein shakes. Pt states he is interested in finding a protein powder option as well. Pt states he is still working on not drinking with meals. Pt states he is doing better with chewing. Pt states he is getting 64 oz of fluid. Pt states he drinks water at most meals; helps cut costs.  Pt states he walks about 2 miles, 5 days/week due to class schedule.    MEDICATIONS: See list   DIETARY INTAKE:  24-hr recall:  First Meal: sausage, egg, cheese english muffin or chicken biscuit Snack: Atkins protein bar Second Meal: leftovers or sandwich (uses double fiber bread), sometimes chips or salads (spring mix, tomatoes, frozen chicken strips) Snack: apple or nuts or 1-2 pretzel sticks Third Meal: pork or chicken, potatoes w/ skin or pasta, broccoli or cabbage Snack: popcorn or popsicle or bar Beverages: water, 2 c coffee, diet soda (socially), sweet tea (occasionally)   Usual physical activity: walking across campus  Diet to Follow: Calories: 1800 kcals Carbohydrate: 200g Protein: 135g Fat: 50g  Preferred Learning Style:   No preference indicated   Learning Readiness:   Contemplating  Ready  Change in progress     Nutritional Diagnosis:  [place]-3.3 Overweight/obesity related to past poor dietary habits and physical inactivity as evidenced by patient w/ planned sleeve gastrectomy surgery following dietary guidelines for continued weight loss.    Intervention:  Nutrition counseling for upcoming Bariatric Surgery.  Goals:  - Take  Multivitamin Complete with iron and Vitamin D supplement 5000 IU.  - Continue to work on not drinking with meals. Try not to have drinks on the table when eating.  - Continue to increase physical activity to at least 30 min, 5 days/week.   Teaching Method Utilized:  Visual Auditory  Handouts given during visit include:  Protein Shakes  Barriers to learning/adherence to lifestyle change: none  Demonstrated degree of understanding via:  Teach Back   Monitoring/Evaluation:  Dietary intake, exercise, and body weight prn.

## 2017-01-18 ENCOUNTER — Encounter: Payer: BLUE CROSS/BLUE SHIELD | Admitting: Skilled Nursing Facility1

## 2017-01-18 DIAGNOSIS — E119 Type 2 diabetes mellitus without complications: Secondary | ICD-10-CM | POA: Diagnosis not present

## 2017-01-18 DIAGNOSIS — Z85038 Personal history of other malignant neoplasm of large intestine: Secondary | ICD-10-CM | POA: Diagnosis not present

## 2017-01-18 DIAGNOSIS — Z713 Dietary counseling and surveillance: Secondary | ICD-10-CM | POA: Diagnosis not present

## 2017-01-18 DIAGNOSIS — Z9049 Acquired absence of other specified parts of digestive tract: Secondary | ICD-10-CM | POA: Diagnosis not present

## 2017-01-20 ENCOUNTER — Encounter: Payer: Self-pay | Admitting: Skilled Nursing Facility1

## 2017-01-20 DIAGNOSIS — F330 Major depressive disorder, recurrent, mild: Secondary | ICD-10-CM | POA: Diagnosis not present

## 2017-01-20 NOTE — Progress Notes (Signed)
Pre-Operative Nutrition Class:  Appt start time: 1730   End time:  1830.  Patient was seen on 01/18/2017 for Pre-Operative Bariatric Surgery [facility]   Surgery date:  Surgery type: Sleeve Gastrectomy Start weight at [facility]: 249.5 Weight today: Pt came too late  Samples given per MNT protocol. Patient educated on appropriate usage: Bariatric Advantage Multivitamin Lot # [id number] Exp: 07/19  Bariatric Advantage Calcium  Lot # [id number] Exp: nov-05-2016  [place] Protein Shake Lot # [id number] Exp: Aug 31, 2017 The following the learning objectives were met by the patient during this course:  Identify Pre-Op Dietary Goals and will begin 2 weeks pre-operatively  Identify appropriate sources of fluids and proteins   State protein recommendations and appropriate sources pre and post-operatively  Identify Post-Operative Dietary Goals and will follow for 2 weeks post-operatively  Identify appropriate multivitamin and calcium sources  Describe the need for physical activity post-operatively and will follow MD recommendations  State when to call healthcare provider regarding medication questions or post-operative complications  Handouts given during class include:  Pre-Op Bariatric Surgery Diet Handout  Protein Shake Handout  Post-Op Bariatric Surgery Nutrition Handout  BELT Program Information Flyer  Support Group Information Flyer  [facility] Bariatric Supplements Price List  Follow-Up Plan: Patient will follow-up at [facility] 2 weeks post operatively for diet advancement per MD.   

## 2017-01-25 DIAGNOSIS — Z789 Other specified health status: Secondary | ICD-10-CM | POA: Diagnosis not present

## 2017-01-29 DIAGNOSIS — F509 Eating disorder, unspecified: Secondary | ICD-10-CM | POA: Diagnosis not present

## 2017-02-17 DIAGNOSIS — E1165 Type 2 diabetes mellitus with hyperglycemia: Secondary | ICD-10-CM | POA: Diagnosis not present

## 2017-02-17 DIAGNOSIS — Z5181 Encounter for therapeutic drug level monitoring: Secondary | ICD-10-CM | POA: Diagnosis not present

## 2017-02-17 DIAGNOSIS — Z79899 Other long term (current) drug therapy: Secondary | ICD-10-CM | POA: Diagnosis not present

## 2017-02-17 DIAGNOSIS — E1136 Type 2 diabetes mellitus with diabetic cataract: Secondary | ICD-10-CM | POA: Diagnosis not present

## 2017-06-03 DIAGNOSIS — G4733 Obstructive sleep apnea (adult) (pediatric): Secondary | ICD-10-CM | POA: Diagnosis not present

## 2017-06-03 DIAGNOSIS — Z9989 Dependence on other enabling machines and devices: Secondary | ICD-10-CM | POA: Diagnosis not present

## 2017-06-03 DIAGNOSIS — Z85038 Personal history of other malignant neoplasm of large intestine: Secondary | ICD-10-CM | POA: Diagnosis not present

## 2017-06-04 ENCOUNTER — Encounter: Payer: Self-pay | Admitting: *Deleted

## 2017-06-07 DIAGNOSIS — E1165 Type 2 diabetes mellitus with hyperglycemia: Secondary | ICD-10-CM | POA: Diagnosis not present

## 2017-06-07 DIAGNOSIS — E1136 Type 2 diabetes mellitus with diabetic cataract: Secondary | ICD-10-CM | POA: Diagnosis not present

## 2017-06-07 DIAGNOSIS — Z5181 Encounter for therapeutic drug level monitoring: Secondary | ICD-10-CM | POA: Diagnosis not present

## 2017-06-24 ENCOUNTER — Ambulatory Visit: Payer: BLUE CROSS/BLUE SHIELD | Admitting: Oncology

## 2017-07-08 DIAGNOSIS — G4733 Obstructive sleep apnea (adult) (pediatric): Secondary | ICD-10-CM | POA: Diagnosis not present

## 2017-07-22 ENCOUNTER — Ambulatory Visit: Payer: Self-pay | Admitting: General Surgery

## 2017-07-30 DIAGNOSIS — G4733 Obstructive sleep apnea (adult) (pediatric): Secondary | ICD-10-CM | POA: Diagnosis not present

## 2017-07-30 DIAGNOSIS — Z9989 Dependence on other enabling machines and devices: Secondary | ICD-10-CM | POA: Diagnosis not present

## 2017-07-30 DIAGNOSIS — E1169 Type 2 diabetes mellitus with other specified complication: Secondary | ICD-10-CM | POA: Diagnosis not present

## 2017-07-30 NOTE — Progress Notes (Signed)
LM on VM for James Buck informing her that I have left 3 VM messages on phone for patient with no return call to set up pre-op appointment.

## 2017-07-30 NOTE — Patient Instructions (Addendum)
James Buck  07/30/2017   Your procedure is scheduled on: Monday 08/16/2017  Report to [facility] Main  Entrance             Report to admitting at  0530  AM    Call this number if you have problems the morning of surgery [phone]               Please bring your CPAP mask and tubing to the hospital the morning of surgery!         MORNING OF SURGERY DRINK:  1SHAKE BEFORE YOU LEAVE HOME, DRINK ALL OF THE SHAKE AT ONE TIME.   NO SOLID FOOD AFTER 600 PM THE NIGHT BEFORE YOUR SURGERY. YOU MAY DRINK CLEAR FLUIDS. THE SHAKE YOU DRINK BEFORE YOU LEAVE HOME WILL BE THE LAST FLUIDS YOU DRINK BEFORE SURGERY.  PAIN IS EXPECTED AFTER SURGERY AND WILL NOT BE COMPLETELY ELIMINATED. AMBULATION AND TYLENOL WILL HELP REDUCE INCISIONAL AND GAS PAIN. MOVEMENT IS KEY!  YOU ARE EXPECTED TO BE OUT OF BED WITHIN 4 HOURS OF ADMISSION TO YOUR PATIENT ROOM.  SITTING IN THE RECLINER THROUGHOUT THE DAY IS IMPORTANT FOR DRINKING FLUIDS AND MOVING GAS THROUGHOUT THE GI TRACT.  COMPRESSION STOCKINGS SHOULD BE WORN [place] UNLESS YOU ARE WALKING.   INCENTIVE SPIROMETER SHOULD BE USED EVERY HOUR WHILE AWAKE TO DECREASE POST-OPERATIVE COMPLICATIONS SUCH AS PNEUMONIA.  WHEN DISCHARGED HOME, IT IS IMPORTANT TO CONTINUE TO WALK EVERY HOUR AND USE THE INCENTIVE SPIROMETER EVERY HOUR.       CLEAR LIQUID DIET   Foods Allowed                                                                     Foods Excluded  Coffee and tea, regular and decaf                             liquids that you cannot  Plain Jell-O in any flavor                                             see through such as: Fruit ices (not with fruit pulp)                                     milk, soups, orange juice  Iced Popsicles                                    All solid food Carbonated beverages, regular and diet                                    Cranberry, grape and apple juices Sports drinks  like Gatorade Lightly seasoned clear broth or consume(fat free) Sugar, honey syrup  Sample  Menu Breakfast                                Lunch                                     Supper Cranberry juice                    Beef broth                            Chicken broth Jell-O                                     Grape juice                           Apple juice Coffee or tea                        Jell-O                                      Popsicle                                                Coffee or tea                        Coffee or tea  _____________________________________________________________________  How to Manage Your Diabetes Before and After Surgery  Why is it important to control my blood sugar before and after surgery? . Improving blood sugar levels before and after surgery helps healing and can limit problems. . A way of improving blood sugar control is eating a healthy diet by: o  Eating less sugar and carbohydrates o  Increasing activity/exercise o  Talking with your doctor about reaching your blood sugar goals . High blood sugars (greater than 180 mg/dL) can raise your risk of infections and slow your recovery, so you will need to focus on controlling your diabetes during the weeks before surgery. . Make sure that the doctor who takes care of your diabetes knows about your planned surgery including the date and location.  How do I manage my blood sugar before surgery? . Check your blood sugar at least 4 times a day, starting 2 days before surgery, to make sure that the level is not too high or low. o Check your blood sugar the morning of your surgery when you wake up and every 2 hours until you get to the Short Stay unit. . If your blood sugar is less than 70 mg/dL, you will need to treat for low blood sugar: o Do not take insulin. o Treat a low blood sugar (less than 70 mg/dL) with  cup of clear juice (cranberry or apple), 4 glucose tablets, OR glucose  gel. o Recheck blood sugar in 15 minutes after treatment (to make sure it is greater than 70 mg/dL). If your blood sugar  is not greater than 70 mg/dL on recheck, call [phone] for further instructions. . Report your blood sugar to the short stay nurse when you get to Short Stay.  . If you are admitted to the hospital after surgery: o Your blood sugar will be checked by the staff and you will probably be given insulin after surgery (instead of oral diabetes medicines) to make sure you have good blood sugar levels. o The goal for blood sugar control after surgery is 80-180 mg/dL.   WHAT DO I DO ABOUT MY DIABETES MEDICATION?         The day before surgery, take the Pioglitazone as usual!        The day before surgery, take the Metformin ER as usual.         . Do not take oral diabetes medicines (pills) the morning of surgery.   . The day of surgery, do not take other diabetes injectables, including Byetta (exenatide), Bydureon (exenatide ER), Victoza (liraglutide), or Trulicity (dulaglutide).     Remember: Do not eat food  :After after 6 pm.     Take these medicines the morning of surgery with A SIP OF WATER: use Flonase nasal spray   DO NOT TAKE ANY DIABETIC MEDICATIONS DAY OF YOUR SURGERY!                               You may not have any metal on your body including hair pins and              piercings  Do not wear jewelry, make-up, lotions, powders or perfumes, deodorant                         Men may shave face and neck.   Do not bring valuables to the hospital. [place].  Contacts, dentures or bridgework may not be worn into surgery.  Leave suitcase in the car. After surgery it may be brought to your room.                  Please read over the following fact sheets you were given: _____________________________________________________________________             [facility] - Preparing for Surgery Before  surgery, you can play an important role.  Because skin is not sterile, your skin needs to be as free of germs as possible.  You can reduce the number of germs on your skin by washing with CHG (chlorahexidine gluconate) soap before surgery.  CHG is an antiseptic cleaner which kills germs and bonds with the skin to continue killing germs even after washing. Please DO NOT use if you have an allergy to CHG or antibacterial soaps.  If your skin becomes reddened/irritated stop using the CHG and inform your nurse when you arrive at Short Stay. Do not shave (including legs and underarms) for at least 48 hours prior to the first CHG shower.  You may shave your face/neck. Please follow these instructions carefully:  1.  Shower with CHG Soap the night before surgery and the  morning of Surgery.  2.  If you choose to wash your hair, wash your hair first as usual with your  normal  shampoo.  3.  After you shampoo, rinse your hair and body thoroughly  to remove the  shampoo.                           4.  Use CHG as you would any other liquid soap.  You can apply chg directly  to the skin and wash                       Gently with a scrungie or clean washcloth.  5.  Apply the CHG Soap to your body ONLY FROM THE NECK DOWN.   Do not use on face/ open                           Wound or open sores. Avoid contact with eyes, ears mouth and genitals (private parts).                       Wash face,  Genitals (private parts) with your normal soap.             6.  Wash thoroughly, paying special attention to the area where your surgery  will be performed.  7.  Thoroughly rinse your body with warm water from the neck down.  8.  DO NOT shower/wash with your normal soap after using and rinsing off  the CHG Soap.                9.  Pat yourself dry with a clean towel.            10.  Wear clean pajamas.            11.  Place clean sheets on your bed the night of your first shower and do not  sleep with pets. Day of Surgery  : Do not apply any lotions/deodorants the morning of surgery.  Please wear clean clothes to the hospital/surgery center.  FAILURE TO FOLLOW THESE INSTRUCTIONS MAY RESULT IN THE CANCELLATION OF YOUR SURGERY PATIENT SIGNATURE_________________________________  NURSE SIGNATURE__________________________________  ________________________________________________________________________   Adam Phenix  An incentive spirometer is a tool that can help keep your lungs clear and active. This tool measures how well you are filling your lungs with each breath. Taking long deep breaths may help reverse or decrease the chance of developing breathing (pulmonary) problems (especially infection) following:  A long period of time when you are unable to move or be active. BEFORE THE PROCEDURE   If the spirometer includes an indicator to show your best effort, your nurse or respiratory therapist will set it to a desired goal.  If possible, sit up straight or lean slightly forward. Try not to slouch.  Hold the incentive spirometer in an upright position. INSTRUCTIONS FOR USE  1. Sit on the edge of your bed if possible, or sit up as far as you can in bed or on a chair. 2. Hold the incentive spirometer in an upright position. 3. Breathe out normally. 4. Place the mouthpiece in your mouth and seal your lips tightly around it. 5. Breathe in slowly and as deeply as possible, raising the piston or the ball toward the top of the column. 6. Hold your breath for 3-5 seconds or for as long as possible. Allow the piston or ball to fall to the bottom of the column. 7. Remove the mouthpiece from your mouth and breathe out normally. 8. Rest for a few seconds and repeat Steps 1 through 7 at  least 10 times every 1-2 hours when you are awake. Take your time and take a few normal breaths between deep breaths. 9. The spirometer may include an indicator to show your best effort. Use the indicator as a goal to work  toward during each repetition. 10. After each set of 10 deep breaths, practice coughing to be sure your lungs are clear. If you have an incision (the cut made at the time of surgery), support your incision when coughing by placing a pillow or rolled up towels firmly against it. Once you are able to get out of bed, walk around indoors and cough well. You may stop using the incentive spirometer when instructed by your caregiver.  RISKS AND COMPLICATIONS  Take your time so you do not get dizzy or light-headed.  If you are in pain, you may need to take or ask for pain medication before doing incentive spirometry. It is harder to take a deep breath if you are having pain. AFTER USE  Rest and breathe slowly and easily.  It can be helpful to keep track of a log of your progress. Your caregiver can provide you with a simple table to help with this. If you are using the spirometer at home, follow these instructions: [place] IF:   You are having difficultly using the spirometer.  You have trouble using the spirometer as often as instructed.  Your pain medication is not giving enough relief while using the spirometer.  You develop fever of 100.5 F (38.1 C) or higher. SEEK IMMEDIATE MEDICAL CARE IF:   You cough up bloody sputum that had not been present before.  You develop fever of 102 F (38.9 C) or greater.  You develop worsening pain at or near the incision site. MAKE SURE YOU:   Understand these instructions.  Will watch your condition.  Will get help right away if you are not doing well or get worse. Document Released: 08/03/2006 Document Revised: 06/15/2011 Document Reviewed: 10/04/2006 Exit[employer] Patient Information 2014 [employer], [place].   ________________________________________________________________________  WHAT IS A BLOOD TRANSFUSION? Blood Transfusion Information  A transfusion is the replacement of blood or some of its parts. Blood is made up of multiple  cells which provide different functions.  Red blood cells carry oxygen and are used for blood loss replacement.  White blood cells fight against infection.  Platelets control bleeding.  Plasma helps clot blood.  Other blood products are available for specialized needs, such as hemophilia or other clotting disorders. BEFORE THE TRANSFUSION  Who gives blood for transfusions?   Healthy volunteers who are fully evaluated to make sure their blood is safe. This is blood bank blood. Transfusion therapy is the safest it has ever been in the practice of medicine. Before blood is taken from a donor, a complete history is taken to make sure that person has no history of diseases nor engages in risky social behavior (examples are intravenous drug use or sexual activity with multiple partners). The donor's travel history is screened to minimize risk of transmitting infections, such as malaria. The donated blood is tested for signs of infectious diseases, such as HIV and hepatitis. The blood is then tested to be sure it is compatible with you in order to minimize the chance of a transfusion reaction. If you or a relative donates blood, this is often done in anticipation of surgery and is not appropriate for emergency situations. It takes many days to process the donated blood. RISKS AND COMPLICATIONS Although transfusion  therapy is very safe and saves many lives, the main dangers of transfusion include:   Getting an infectious disease.  Developing a transfusion reaction. This is an allergic reaction to something in the blood you were given. Every precaution is taken to prevent this. The decision to have a blood transfusion has been considered carefully by your caregiver before blood is given. Blood is not given unless the benefits outweigh the risks. AFTER THE TRANSFUSION  Right after receiving a blood transfusion, you will usually feel much better and more energetic. This is especially true if your red  blood cells have gotten low (anemic). The transfusion raises the level of the red blood cells which carry oxygen, and this usually causes an energy increase.  The nurse administering the transfusion will monitor you carefully for complications. HOME CARE INSTRUCTIONS  No special instructions are needed after a transfusion. You may find your energy is better. Speak with your caregiver about any limitations on activity for underlying diseases you may have. SEEK MEDICAL CARE IF:   Your condition is not improving after your transfusion.  You develop redness or irritation at the intravenous (IV) site. SEEK IMMEDIATE MEDICAL CARE IF:  Any of the following symptoms occur over the next 12 hours:  Shaking chills.  You have a temperature by mouth above 102 F (38.9 C), not controlled by medicine.  Chest, back, or muscle pain.  People around you feel you are not acting correctly or are confused.  Shortness of breath or difficulty breathing.  Dizziness and fainting.  You get a rash or develop hives.  You have a decrease in urine output.  Your urine turns a dark color or changes to pink, red, or brown. Any of the following symptoms occur over the next 10 days:  You have a temperature by mouth above 102 F (38.9 C), not controlled by medicine.  Shortness of breath.  Weakness after normal activity.  The white part of the eye turns yellow (jaundice).  You have a decrease in the amount of urine or are urinating less often.  Your urine turns a dark color or changes to pink, red, or brown. Document Released: 03/20/2000 Document Revised: 06/15/2011 Document Reviewed: 11/07/2007 [facility] Patient Information 2014 [place], [place].  _______________________________________________________________________

## 2017-08-02 ENCOUNTER — Other Ambulatory Visit: Payer: Self-pay

## 2017-08-02 ENCOUNTER — Encounter (HOSPITAL_COMMUNITY)
Admission: RE | Admit: 2017-08-02 | Discharge: 2017-08-02 | Disposition: A | Discharge status: Home / Self Care | Payer: BLUE CROSS/BLUE SHIELD | Source: Ambulatory Visit | Attending: General Surgery | Admitting: General Surgery

## 2017-08-02 ENCOUNTER — Encounter (HOSPITAL_COMMUNITY): Payer: Self-pay

## 2017-08-02 DIAGNOSIS — Z6837 Body mass index (BMI) 37.0-37.9, adult: Secondary | ICD-10-CM | POA: Diagnosis not present

## 2017-08-02 DIAGNOSIS — Z01812 Encounter for preprocedural laboratory examination: Secondary | ICD-10-CM | POA: Insufficient documentation

## 2017-08-02 HISTORY — DX: Adverse effect of unspecified anesthetic, initial encounter: T41.45XA

## 2017-08-02 HISTORY — DX: Other complications of anesthesia, initial encounter: T88.59XA

## 2017-08-02 LAB — COMPREHENSIVE METABOLIC PANEL
ALT: 51 U/L (ref 17–63)
ANION GAP: 11 (ref 5–15)
AST: 29 U/L (ref 15–41)
Albumin: 3.9 g/dL (ref 3.5–5.0)
Alkaline Phosphatase: 88 U/L (ref 38–126)
BUN: 14 mg/dL (ref 6–20)
CO2: 23 mmol/L (ref 22–32)
CREATININE: 0.9 mg/dL (ref 0.61–1.24)
Calcium: 9.2 mg/dL (ref 8.9–10.3)
Chloride: 104 mmol/L (ref 101–111)
GFR calc non Af Amer: >60 mL/min (ref >60)
Glucose, Bld: 304 mg/dL — ABNORMAL HIGH (ref 65–99)
Potassium: 4.2 mmol/L (ref 3.5–5.1)
Sodium: 138 mmol/L (ref 135–145)
Total Bilirubin: 1.2 mg/dL (ref 0.3–1.2)
Total Protein: 6.7 g/dL (ref 6.5–8.1)

## 2017-08-02 LAB — HEMOGLOBIN A1C
Hgb A1c MFr Bld: 8.6 % — ABNORMAL HIGH (ref 4.8–5.6)
MEAN PLASMA GLUCOSE: 200.12 mg/dL

## 2017-08-02 LAB — CBC WITH DIFFERENTIAL/PLATELET
Basophils Absolute: 0 10*3/uL (ref 0.0–0.1)
Basophils Relative: 1 %
EOS ABS: 0.3 10*3/uL (ref 0.0–0.7)
Eosinophils Relative: 5 %
HCT: 45.3 % (ref 39.0–52.0)
Hemoglobin: 15.3 g/dL (ref 13.0–17.0)
LYMPHS ABS: 1.2 10*3/uL (ref 0.7–4.0)
Lymphocytes Relative: 20 %
MCH: 32.2 pg (ref 26.0–34.0)
MCHC: 33.8 g/dL (ref 30.0–36.0)
MCV: 95.4 fL (ref 78.0–100.0)
Monocytes Absolute: 0.6 10*3/uL (ref 0.1–1.0)
Monocytes Relative: 10 %
Neutro Abs: 3.9 10*3/uL (ref 1.7–7.7)
Neutrophils Relative %: 64 %
Platelets: 232 10*3/uL (ref 150–400)
RBC: 4.75 MIL/uL (ref 4.22–5.81)
RDW: 12.8 % (ref 11.5–15.5)
WBC: 6.1 10*3/uL (ref 4.0–10.5)

## 2017-08-02 LAB — GLUCOSE, CAPILLARY: Glucose-Capillary: 319 mg/dL — ABNORMAL HIGH (ref 65–99)

## 2017-08-02 NOTE — Progress Notes (Signed)
09/15/2016- Noted in [place] and CXR

## 2017-08-14 ENCOUNTER — Encounter (HOSPITAL_COMMUNITY): Payer: Self-pay | Admitting: Anesthesiology

## 2017-08-14 NOTE — Anesthesia Preprocedure Evaluation (Addendum)
Anesthesia Evaluation  Patient identified by MRN, date of birth, ID band Patient awake    Reviewed: Allergy & Precautions, NPO status , Patient's Chart, lab work & pertinent test results  History of Anesthesia Complications (+) AWARENESS UNDER ANESTHESIA and history of anesthetic complications  Airway Mallampati: III  TM Distance: >3 FB Neck ROM: Full    Dental  (+) Teeth Intact   Pulmonary sleep apnea and Continuous Positive Airway Pressure Ventilation , former smoker,    Pulmonary exam normal breath sounds clear to auscultation       Cardiovascular negative cardio ROS   Rhythm:Regular Rate:Normal     Neuro/Psych Diabetic peripheral neuropathy  Neuromuscular disease negative psych ROS   GI/Hepatic Neg liver ROS, Hx/o Colon Ca- S/P resection and ChemoRx   Endo/Other  diabetes, Poorly Controlled, Type 2, Oral Hypoglycemic AgentsMorbid obesity  Renal/GU negative Renal ROS  negative genitourinary   Musculoskeletal  (+) Arthritis , Osteoarthritis,  Bilateral knees   Abdominal (+) + obese,   Peds  Hematology negative hematology ROS (+)   Anesthesia Other Findings   Reproductive/Obstetrics                            Anesthesia Physical Anesthesia Plan  ASA: III  Anesthesia Plan: General   Post-op Pain Management:    Induction: Intravenous and Cricoid pressure planned  PONV Risk Score and Plan: 4 or greater and Scopolamine patch - Pre-op, Midazolam, Dexamethasone, Ondansetron and Treatment may vary due to age or medical condition  Airway Management Planned: Oral ETT  Additional Equipment:   Intra-op Plan:   Post-operative Plan: Extubation in OR  Informed Consent: I have reviewed the patients History and Physical, chart, labs and discussed the procedure including the risks, benefits and alternatives for the proposed anesthesia with the patient or authorized representative who has  indicated his/her understanding and acceptance.   Dental advisory given  Plan Discussed with: CRNA and Surgeon  Anesthesia Plan Comments:        Anesthesia Quick Evaluation

## 2017-08-16 ENCOUNTER — Inpatient Hospital Stay (HOSPITAL_COMMUNITY): Payer: BLUE CROSS/BLUE SHIELD | Admitting: Certified Registered Nurse Anesthetist

## 2017-08-16 ENCOUNTER — Inpatient Hospital Stay (HOSPITAL_COMMUNITY)
Admission: RE | Admit: 2017-08-16 | Discharge: 2017-08-17 | DRG: 621 | Disposition: A | Discharge status: Home / Self Care | Payer: BLUE CROSS/BLUE SHIELD | Source: Ambulatory Visit | Attending: General Surgery | Admitting: General Surgery

## 2017-08-16 ENCOUNTER — Encounter (HOSPITAL_COMMUNITY)
Admission: RE | Disposition: A | Discharge status: Home / Self Care | Payer: Self-pay | Source: Ambulatory Visit | Attending: General Surgery

## 2017-08-16 ENCOUNTER — Encounter (HOSPITAL_COMMUNITY): Payer: Self-pay | Admitting: *Deleted

## 2017-08-16 ENCOUNTER — Other Ambulatory Visit: Payer: Self-pay

## 2017-08-16 DIAGNOSIS — G8929 Other chronic pain: Secondary | ICD-10-CM | POA: Diagnosis not present

## 2017-08-16 DIAGNOSIS — Z9884 Bariatric surgery status: Secondary | ICD-10-CM

## 2017-08-16 DIAGNOSIS — G4733 Obstructive sleep apnea (adult) (pediatric): Secondary | ICD-10-CM | POA: Diagnosis not present

## 2017-08-16 DIAGNOSIS — K219 Gastro-esophageal reflux disease without esophagitis: Secondary | ICD-10-CM | POA: Diagnosis present

## 2017-08-16 DIAGNOSIS — Z85038 Personal history of other malignant neoplasm of large intestine: Secondary | ICD-10-CM | POA: Diagnosis not present

## 2017-08-16 DIAGNOSIS — Z8249 Family history of ischemic heart disease and other diseases of the circulatory system: Secondary | ICD-10-CM | POA: Diagnosis not present

## 2017-08-16 DIAGNOSIS — K660 Peritoneal adhesions (postprocedural) (postinfection): Secondary | ICD-10-CM | POA: Diagnosis not present

## 2017-08-16 DIAGNOSIS — Z808 Family history of malignant neoplasm of other organs or systems: Secondary | ICD-10-CM | POA: Diagnosis not present

## 2017-08-16 DIAGNOSIS — T451X5S Adverse effect of antineoplastic and immunosuppressive drugs, sequela: Secondary | ICD-10-CM

## 2017-08-16 DIAGNOSIS — G622 Polyneuropathy due to other toxic agents: Secondary | ICD-10-CM | POA: Diagnosis present

## 2017-08-16 DIAGNOSIS — Z7982 Long term (current) use of aspirin: Secondary | ICD-10-CM

## 2017-08-16 DIAGNOSIS — M170 Bilateral primary osteoarthritis of knee: Secondary | ICD-10-CM | POA: Diagnosis present

## 2017-08-16 DIAGNOSIS — Z9989 Dependence on other enabling machines and devices: Secondary | ICD-10-CM

## 2017-08-16 DIAGNOSIS — E119 Type 2 diabetes mellitus without complications: Secondary | ICD-10-CM | POA: Diagnosis present

## 2017-08-16 DIAGNOSIS — Z9049 Acquired absence of other specified parts of digestive tract: Secondary | ICD-10-CM

## 2017-08-16 DIAGNOSIS — K449 Diaphragmatic hernia without obstruction or gangrene: Secondary | ICD-10-CM | POA: Diagnosis present

## 2017-08-16 DIAGNOSIS — Z87891 Personal history of nicotine dependence: Secondary | ICD-10-CM | POA: Diagnosis not present

## 2017-08-16 DIAGNOSIS — E559 Vitamin D deficiency, unspecified: Secondary | ICD-10-CM | POA: Diagnosis not present

## 2017-08-16 DIAGNOSIS — Z6837 Body mass index (BMI) 37.0-37.9, adult: Secondary | ICD-10-CM

## 2017-08-16 DIAGNOSIS — Z7984 Long term (current) use of oral hypoglycemic drugs: Secondary | ICD-10-CM

## 2017-08-16 DIAGNOSIS — E1140 Type 2 diabetes mellitus with diabetic neuropathy, unspecified: Secondary | ICD-10-CM | POA: Diagnosis not present

## 2017-08-16 DIAGNOSIS — C189 Malignant neoplasm of colon, unspecified: Secondary | ICD-10-CM

## 2017-08-16 DIAGNOSIS — K649 Unspecified hemorrhoids: Secondary | ICD-10-CM | POA: Diagnosis not present

## 2017-08-16 DIAGNOSIS — Z23 Encounter for immunization: Secondary | ICD-10-CM | POA: Diagnosis not present

## 2017-08-16 HISTORY — PX: LAPAROSCOPIC GASTRIC SLEEVE RESECTION: SHX5895

## 2017-08-16 LAB — HEMOGLOBIN AND HEMATOCRIT, BLOOD
HEMATOCRIT: 42.9 % (ref 39.0–52.0)
HEMOGLOBIN: 14.3 g/dL (ref 13.0–17.0)

## 2017-08-16 LAB — GLUCOSE, CAPILLARY
GLUCOSE-CAPILLARY: 260 mg/dL — AB (ref 65–99)
GLUCOSE-CAPILLARY: 217 mg/dL — AB (ref 65–99)
GLUCOSE-CAPILLARY: 221 mg/dL — AB (ref 65–99)
GLUCOSE-CAPILLARY: 211 mg/dL — AB (ref 65–99)
GLUCOSE-CAPILLARY: 206 mg/dL — AB (ref 65–99)
Glucose-Capillary: 224 mg/dL — ABNORMAL HIGH (ref 65–99)

## 2017-08-16 LAB — ABO/RH: ABO/RH(D): O POS

## 2017-08-16 LAB — TYPE AND SCREEN
ABO/RH(D): O POS
ANTIBODY SCREEN: NEGATIVE

## 2017-08-16 SURGERY — GASTRECTOMY, SLEEVE, LAPAROSCOPIC
Anesthesia: General | Site: Abdomen

## 2017-08-16 MED ORDER — HYDROMORPHONE HCL 1 MG/ML IJ SOLN
INTRAMUSCULAR | Status: AC
  Filled 2017-08-16: qty 1

## 2017-08-16 MED ORDER — INSULIN ASPART 100 UNIT/ML ~~LOC~~ SOLN
SUBCUTANEOUS | Status: AC
  Filled 2017-08-16: qty 1

## 2017-08-16 MED ORDER — LIDOCAINE 2% (20 MG/ML) 5 ML SYRINGE
INTRAMUSCULAR | Status: AC
  Filled 2017-08-16: qty 5

## 2017-08-16 MED ORDER — PHENYLEPHRINE 40 MCG/ML (10ML) SYRINGE FOR IV PUSH (FOR BLOOD PRESSURE SUPPORT)
PREFILLED_SYRINGE | INTRAVENOUS | Status: AC
  Filled 2017-08-16: qty 10

## 2017-08-16 MED ORDER — GABAPENTIN 100 MG PO CAPS
200.0000 mg | ORAL_CAPSULE | Freq: Two times a day (BID) | ORAL | Status: DC
  Administered 2017-08-16 – 2017-08-17 (×2): 200 mg via ORAL
  Filled 2017-08-16 (×2): qty 2

## 2017-08-16 MED ORDER — PNEUMOCOCCAL VAC POLYVALENT 25 MCG/0.5ML IJ INJ
0.5000 mL | INJECTION | INTRAMUSCULAR | Status: AC
  Administered 2017-08-17: 0.5 mL via INTRAMUSCULAR
  Filled 2017-08-16: qty 0.5

## 2017-08-16 MED ORDER — SUGAMMADEX SODIUM 500 MG/5ML IV SOLN
INTRAVENOUS | Status: DC | PRN
  Administered 2017-08-16: 300 mg via INTRAVENOUS

## 2017-08-16 MED ORDER — CEFAZOLIN SODIUM-DEXTROSE 2-4 GM/100ML-% IV SOLN
INTRAVENOUS | Status: AC
  Filled 2017-08-16: qty 100

## 2017-08-16 MED ORDER — LACTATED RINGERS IR SOLN
Status: DC | PRN
  Administered 2017-08-16: 1000 mL

## 2017-08-16 MED ORDER — ACETAMINOPHEN 500 MG PO TABS
1000.0000 mg | ORAL_TABLET | ORAL | Status: AC
  Administered 2017-08-16: 1000 mg via ORAL
  Filled 2017-08-16: qty 2

## 2017-08-16 MED ORDER — ROCURONIUM BROMIDE 10 MG/ML (PF) SYRINGE
PREFILLED_SYRINGE | INTRAVENOUS | Status: AC
  Filled 2017-08-16: qty 5

## 2017-08-16 MED ORDER — CHLORHEXIDINE GLUCONATE 4 % EX LIQD: 60.0000 mL | Freq: Once | CUTANEOUS | Status: DC

## 2017-08-16 MED ORDER — SCOPOLAMINE 1 MG/3DAYS TD PT72
1.0000 | MEDICATED_PATCH | TRANSDERMAL | Status: DC
  Administered 2017-08-16: 1.5 mg via TRANSDERMAL
  Filled 2017-08-16: qty 1

## 2017-08-16 MED ORDER — FENTANYL CITRATE (PF) 100 MCG/2ML IJ SOLN
INTRAMUSCULAR | Status: DC | PRN
  Administered 2017-08-16: 50 ug via INTRAVENOUS
  Administered 2017-08-16: 100 ug via INTRAVENOUS
  Administered 2017-08-16: 50 ug via INTRAVENOUS

## 2017-08-16 MED ORDER — PROMETHAZINE HCL 25 MG/ML IJ SOLN: 12.5000 mg | Freq: Four times a day (QID) | INTRAMUSCULAR | Status: DC | PRN

## 2017-08-16 MED ORDER — LIDOCAINE 2% (20 MG/ML) 5 ML SYRINGE
INTRAMUSCULAR | Status: DC | PRN
  Administered 2017-08-16: 100 mg via INTRAVENOUS

## 2017-08-16 MED ORDER — HYDROMORPHONE HCL 1 MG/ML IJ SOLN
0.2500 mg | INTRAMUSCULAR | Status: DC | PRN
  Administered 2017-08-16 (×2): 0.5 mg via INTRAVENOUS

## 2017-08-16 MED ORDER — DEXAMETHASONE SODIUM PHOSPHATE 10 MG/ML IJ SOLN
INTRAMUSCULAR | Status: AC
  Filled 2017-08-16: qty 1

## 2017-08-16 MED ORDER — LIDOCAINE HCL 2 % IJ SOLN
INTRAMUSCULAR | Status: AC
  Filled 2017-08-16: qty 20

## 2017-08-16 MED ORDER — APREPITANT 40 MG PO CAPS
40.0000 mg | ORAL_CAPSULE | ORAL | Status: AC
  Administered 2017-08-16: 40 mg via ORAL
  Filled 2017-08-16: qty 1

## 2017-08-16 MED ORDER — ONDANSETRON HCL 4 MG/2ML IJ SOLN
INTRAMUSCULAR | Status: AC
  Filled 2017-08-16: qty 2

## 2017-08-16 MED ORDER — MIDAZOLAM HCL 5 MG/5ML IJ SOLN
INTRAMUSCULAR | Status: DC | PRN
  Administered 2017-08-16: 2 mg via INTRAVENOUS

## 2017-08-16 MED ORDER — BUPIVACAINE LIPOSOME 1.3 % IJ SUSP
20.0000 mL | Freq: Once | INTRAMUSCULAR | Status: AC
  Administered 2017-08-16: 20 mL
  Filled 2017-08-16: qty 20

## 2017-08-16 MED ORDER — PROMETHAZINE HCL 25 MG/ML IJ SOLN: 6.2500 mg | INTRAMUSCULAR | Status: DC | PRN

## 2017-08-16 MED ORDER — LIDOCAINE 2% (20 MG/ML) 5 ML SYRINGE
INTRAMUSCULAR | Status: DC | PRN
  Administered 2017-08-16: 1.5 mg/kg/h via INTRAVENOUS

## 2017-08-16 MED ORDER — ENOXAPARIN SODIUM 30 MG/0.3ML ~~LOC~~ SOLN
30.0000 mg | Freq: Two times a day (BID) | SUBCUTANEOUS | Status: DC
  Administered 2017-08-16 – 2017-08-17 (×2): 30 mg via SUBCUTANEOUS
  Filled 2017-08-16 (×2): qty 0.3

## 2017-08-16 MED ORDER — ACETAMINOPHEN 160 MG/5ML PO SOLN
650.0000 mg | Freq: Four times a day (QID) | ORAL | Status: DC
  Administered 2017-08-16 – 2017-08-17 (×3): 650 mg via ORAL
  Filled 2017-08-16 (×3): qty 20.3

## 2017-08-16 MED ORDER — SODIUM CHLORIDE 0.9 % IJ SOLN
INTRAMUSCULAR | Status: DC | PRN
Start: 2017-08-16 — End: 2017-08-16
  Administered 2017-08-16: 50 mL

## 2017-08-16 MED ORDER — MIDAZOLAM HCL 2 MG/2ML IJ SOLN
INTRAMUSCULAR | Status: AC
  Filled 2017-08-16: qty 2

## 2017-08-16 MED ORDER — ONDANSETRON HCL 4 MG/2ML IJ SOLN
INTRAMUSCULAR | Status: DC | PRN
  Administered 2017-08-16: 4 mg via INTRAVENOUS

## 2017-08-16 MED ORDER — CEFOTETAN DISODIUM-DEXTROSE 2-2.08 GM-%(50ML) IV SOLR
2.0000 g | INTRAVENOUS | Status: AC
  Administered 2017-08-16: 2 g via INTRAVENOUS
  Filled 2017-08-16: qty 50

## 2017-08-16 MED ORDER — ONDANSETRON HCL 4 MG/2ML IJ SOLN: 4.0000 mg | Freq: Four times a day (QID) | INTRAMUSCULAR | Status: DC | PRN

## 2017-08-16 MED ORDER — DEXAMETHASONE SODIUM PHOSPHATE 4 MG/ML IJ SOLN
4.0000 mg | INTRAMUSCULAR | Status: AC
  Administered 2017-08-16: 4 mg via INTRAVENOUS

## 2017-08-16 MED ORDER — KETAMINE HCL 10 MG/ML IJ SOLN
INTRAMUSCULAR | Status: DC | PRN
  Administered 2017-08-16: 35 mg via INTRAVENOUS

## 2017-08-16 MED ORDER — STERILE WATER FOR IRRIGATION IR SOLN
Status: DC | PRN
  Administered 2017-08-16: 1000 mL

## 2017-08-16 MED ORDER — KETAMINE HCL 10 MG/ML IJ SOLN
INTRAMUSCULAR | Status: AC
  Filled 2017-08-16: qty 1

## 2017-08-16 MED ORDER — SUCCINYLCHOLINE CHLORIDE 200 MG/10ML IV SOSY
PREFILLED_SYRINGE | INTRAVENOUS | Status: AC
  Filled 2017-08-16: qty 10

## 2017-08-16 MED ORDER — SIMETHICONE 80 MG PO CHEW: 80.0000 mg | CHEWABLE_TABLET | Freq: Four times a day (QID) | ORAL | Status: DC | PRN

## 2017-08-16 MED ORDER — ROCURONIUM BROMIDE 50 MG/5ML IV SOSY
PREFILLED_SYRINGE | INTRAVENOUS | Status: DC | PRN
  Administered 2017-08-16 (×2): 10 mg via INTRAVENOUS
  Administered 2017-08-16: 60 mg via INTRAVENOUS
  Administered 2017-08-16 (×2): 10 mg via INTRAVENOUS

## 2017-08-16 MED ORDER — METOPROLOL TARTRATE 5 MG/5ML IV SOLN: 5.0000 mg | Freq: Four times a day (QID) | INTRAVENOUS | Status: DC | PRN

## 2017-08-16 MED ORDER — PROPOFOL 10 MG/ML IV BOLUS
INTRAVENOUS | Status: DC | PRN
Start: 2017-08-16 — End: 2017-08-16
  Administered 2017-08-16: 200 mg via INTRAVENOUS

## 2017-08-16 MED ORDER — GABAPENTIN 300 MG PO CAPS
300.0000 mg | ORAL_CAPSULE | ORAL | Status: AC
  Administered 2017-08-16: 300 mg via ORAL
  Filled 2017-08-16: qty 1

## 2017-08-16 MED ORDER — PROPOFOL 10 MG/ML IV BOLUS
INTRAVENOUS | Status: AC
  Filled 2017-08-16: qty 20

## 2017-08-16 MED ORDER — SUGAMMADEX SODIUM 500 MG/5ML IV SOLN
INTRAVENOUS | Status: AC
  Filled 2017-08-16: qty 5

## 2017-08-16 MED ORDER — INSULIN ASPART 100 UNIT/ML ~~LOC~~ SOLN
SUBCUTANEOUS | Status: DC | PRN
  Administered 2017-08-16: 8 [IU] via SUBCUTANEOUS
  Administered 2017-08-16: 10 [IU] via SUBCUTANEOUS

## 2017-08-16 MED ORDER — PREMIER PROTEIN SHAKE
2.0000 [oz_av] | ORAL | Status: DC
  Administered 2017-08-17 (×2): 2 [oz_av] via ORAL

## 2017-08-16 MED ORDER — INSULIN ASPART 100 UNIT/ML ~~LOC~~ SOLN
0.0000 [IU] | SUBCUTANEOUS | Status: DC
  Administered 2017-08-16 (×3): 7 [IU] via SUBCUTANEOUS
  Administered 2017-08-17: 4 [IU] via SUBCUTANEOUS
  Administered 2017-08-17 (×2): 3 [IU] via SUBCUTANEOUS

## 2017-08-16 MED ORDER — MORPHINE SULFATE (PF) 2 MG/ML IV SOLN
1.0000 mg | INTRAVENOUS | Status: DC | PRN
  Administered 2017-08-16: 2 mg via INTRAVENOUS
  Filled 2017-08-16: qty 1

## 2017-08-16 MED ORDER — FLUTICASONE PROPIONATE 50 MCG/ACT NA SUSP
2.0000 | Freq: Every day | NASAL | Status: DC
  Administered 2017-08-17: 2 via NASAL
  Filled 2017-08-16: qty 16

## 2017-08-16 MED ORDER — SUCCINYLCHOLINE CHLORIDE 200 MG/10ML IV SOSY
PREFILLED_SYRINGE | INTRAVENOUS | Status: DC | PRN
  Administered 2017-08-16: 140 mg via INTRAVENOUS

## 2017-08-16 MED ORDER — PHENYLEPHRINE 40 MCG/ML (10ML) SYRINGE FOR IV PUSH (FOR BLOOD PRESSURE SUPPORT)
PREFILLED_SYRINGE | INTRAVENOUS | Status: DC | PRN
  Administered 2017-08-16: 120 ug via INTRAVENOUS
  Administered 2017-08-16: 80 ug via INTRAVENOUS
  Administered 2017-08-16: 40 ug via INTRAVENOUS
  Administered 2017-08-16 (×7): 80 ug via INTRAVENOUS

## 2017-08-16 MED ORDER — FENTANYL CITRATE (PF) 250 MCG/5ML IJ SOLN
INTRAMUSCULAR | Status: AC
  Filled 2017-08-16: qty 5

## 2017-08-16 MED ORDER — HEPARIN SODIUM (PORCINE) 5000 UNIT/ML IJ SOLN
5000.0000 [IU] | INTRAMUSCULAR | Status: AC
  Administered 2017-08-16: 5000 [IU] via SUBCUTANEOUS
  Filled 2017-08-16: qty 1

## 2017-08-16 MED ORDER — POTASSIUM CHLORIDE IN NACL 20-0.45 MEQ/L-% IV SOLN
INTRAVENOUS | Status: DC
  Administered 2017-08-16 (×2): via INTRAVENOUS
  Filled 2017-08-16 (×4): qty 1000

## 2017-08-16 MED ORDER — LACTATED RINGERS IV SOLN
INTRAVENOUS | Status: DC
  Administered 2017-08-16 (×2): via INTRAVENOUS

## 2017-08-16 MED ORDER — OXYCODONE HCL 5 MG/5ML PO SOLN: 5.0000 mg | ORAL | Status: DC | PRN

## 2017-08-16 MED ORDER — DIPHENHYDRAMINE HCL 50 MG/ML IJ SOLN: 12.5000 mg | Freq: Three times a day (TID) | INTRAMUSCULAR | Status: DC | PRN

## 2017-08-16 MED ORDER — FAMOTIDINE IN NACL 20-0.9 MG/50ML-% IV SOLN
20.0000 mg | Freq: Two times a day (BID) | INTRAVENOUS | Status: DC
  Administered 2017-08-16 – 2017-08-17 (×3): 20 mg via INTRAVENOUS
  Filled 2017-08-16 (×3): qty 50

## 2017-08-16 MED ORDER — 0.9 % SODIUM CHLORIDE (POUR BTL) OPTIME
TOPICAL | Status: DC | PRN
  Administered 2017-08-16: 1000 mL

## 2017-08-16 MED ORDER — MEPERIDINE HCL 50 MG/ML IJ SOLN: 6.2500 mg | INTRAMUSCULAR | Status: DC | PRN

## 2017-08-16 MED ORDER — SODIUM CHLORIDE 0.9 % IJ SOLN
INTRAMUSCULAR | Status: AC
  Filled 2017-08-16: qty 50

## 2017-08-16 SURGICAL SUPPLY — 64 items
APPLIER CLIP ROT 10 11.4 M/L (STAPLE)
APPLIER CLIP ROT 13.4 12 LRG (CLIP)
BANDAGE ADH SHEER 1  50/CT (GAUZE/BANDAGES/DRESSINGS) ×2 IMPLANT
BENZOIN TINCTURE PRP APPL 2/3 (GAUZE/BANDAGES/DRESSINGS) ×2 IMPLANT
BLADE SURG SZ11 CARB STEEL (BLADE) ×2 IMPLANT
CABLE HIGH FREQUENCY MONO STRZ (ELECTRODE) IMPLANT
CHLORAPREP W/TINT 26ML (MISCELLANEOUS) ×4 IMPLANT
CLIP APPLIE ROT 10 11.4 M/L (STAPLE) IMPLANT
CLIP APPLIE ROT 13.4 12 LRG (CLIP) IMPLANT
COVER SURGICAL LIGHT HANDLE (MISCELLANEOUS) ×2 IMPLANT
DECANTER SPIKE VIAL GLASS SM (MISCELLANEOUS) ×2 IMPLANT
DEVICE PMI PUNCTURE CLOSURE (MISCELLANEOUS) ×2 IMPLANT
DEVICE SUT QUICK LOAD TK 5 (STAPLE) ×2 IMPLANT
DEVICE SUT TI-KNOT TK 5X26 (MISCELLANEOUS) ×2 IMPLANT
DEVICE SUTURE ENDOST 10MM (ENDOMECHANICALS) ×2 IMPLANT
DISSECTOR BLUNT TIP ENDO 5MM (MISCELLANEOUS) IMPLANT
DRAPE UTILITY XL STRL (DRAPES) ×4 IMPLANT
ELECT L-HOOK LAP 45CM DISP (ELECTROSURGICAL)
ELECT PENCIL ROCKER SW 15FT (MISCELLANEOUS) IMPLANT
ELECT REM PT RETURN 15FT ADLT (MISCELLANEOUS) ×2 IMPLANT
ELECTRODE L-HOOK LAP 45CM DISP (ELECTROSURGICAL) IMPLANT
GAUZE SPONGE 2X2 8PLY STRL LF (GAUZE/BANDAGES/DRESSINGS) IMPLANT
GAUZE SPONGE 4X4 12PLY STRL (GAUZE/BANDAGES/DRESSINGS) IMPLANT
GLOVE BIO SURGEON STRL SZ7.5 (GLOVE) ×2 IMPLANT
GLOVE INDICATOR 8.0 STRL GRN (GLOVE) ×2 IMPLANT
GOWN STRL REUS W/TWL XL LVL3 (GOWN DISPOSABLE) ×8 IMPLANT
GRASPER SUT TROCAR 14GX15 (MISCELLANEOUS) IMPLANT
HOVERMATT SINGLE USE (MISCELLANEOUS) ×2 IMPLANT
KIT BASIN OR (CUSTOM PROCEDURE TRAY) ×2 IMPLANT
MARKER SKIN DUAL TIP RULER LAB (MISCELLANEOUS) ×2 IMPLANT
NEEDLE SPNL 22GX3.5 QUINCKE BK (NEEDLE) ×2 IMPLANT
PACK UNIVERSAL I (CUSTOM PROCEDURE TRAY) ×2 IMPLANT
RELOAD STAPLER 60MM BLK (STAPLE) ×1 IMPLANT
RELOAD STAPLER BLUE 60MM (STAPLE) ×3 IMPLANT
RELOAD STAPLER GOLD 60MM (STAPLE) ×1 IMPLANT
RELOAD STAPLER GREEN 60MM (STAPLE) ×1 IMPLANT
SCISSORS LAP 5X45 EPIX DISP (ENDOMECHANICALS) ×2 IMPLANT
SEALANT SURGICAL APPL DUAL CAN (MISCELLANEOUS) IMPLANT
SET IRRIG TUBING LAPAROSCOPIC (IRRIGATION / IRRIGATOR) ×2 IMPLANT
SHEARS HARMONIC ACE PLUS 45CM (MISCELLANEOUS) ×2 IMPLANT
SLEEVE GASTRECTOMY 40FR VISIGI (MISCELLANEOUS) ×2 IMPLANT
SLEEVE XCEL OPT CAN 5 100 (ENDOMECHANICALS) ×6 IMPLANT
SOLUTION ANTI FOG 6CC (MISCELLANEOUS) ×2 IMPLANT
SPONGE GAUZE 2X2 STER 10/PKG (GAUZE/BANDAGES/DRESSINGS)
SPONGE LAP 18X18 RF (DISPOSABLE) ×2 IMPLANT
STAPLER ECHELON BIOABSB 60 FLE (MISCELLANEOUS) ×10 IMPLANT
STAPLER ECHELON LONG 60 440 (INSTRUMENTS) IMPLANT
STAPLER RELOAD 60MM BLK (STAPLE) ×2
STAPLER RELOAD BLUE 60MM (STAPLE) ×6
STAPLER RELOAD GOLD 60MM (STAPLE) ×2
STAPLER RELOAD GREEN 60MM (STAPLE) ×2
STRIP CLOSURE SKIN 1/2X4 (GAUZE/BANDAGES/DRESSINGS) ×2 IMPLANT
SUT MNCRL AB 4-0 PS2 18 (SUTURE) ×2 IMPLANT
SUT SURGIDAC NAB ES-9 0 48 120 (SUTURE) ×2 IMPLANT
SUT VICRYL 0 TIES 12 18 (SUTURE) ×2 IMPLANT
SYR 20CC LL (SYRINGE) ×2 IMPLANT
SYR 50ML LL SCALE MARK (SYRINGE) ×2 IMPLANT
TOWEL OR 17X26 10 PK STRL BLUE (TOWEL DISPOSABLE) ×2 IMPLANT
TOWEL OR NON WOVEN STRL DISP B (DISPOSABLE) ×2 IMPLANT
TROCAR BLADELESS 15MM (ENDOMECHANICALS) ×2 IMPLANT
TROCAR BLADELESS OPT 5 100 (ENDOMECHANICALS) ×2 IMPLANT
TUBING CONNECTING 10 (TUBING) ×4 IMPLANT
TUBING ENDO SMARTCAP (MISCELLANEOUS) ×2 IMPLANT
TUBING INSUF HEATED (TUBING) ×4 IMPLANT

## 2017-08-16 NOTE — Transfer of Care (Signed)
Immediate Anesthesia Transfer of Care Note  Patient: James Buck  Procedure(s) Performed: LAPAROSCOPIC GASTRIC SLEEVE RESECTION WITH HIATAL HERNIA REPAIR, UPPER ENDOSCOPY (N/A Abdomen)  Patient Location: PACU  Anesthesia Type:General  Level of Consciousness: awake, alert  and patient cooperative  Airway & Oxygen Therapy: Patient Spontanous Breathing and Patient connected to face mask oxygen  Post-op Assessment: Report given to RN and Post -op Vital signs reviewed and stable  Post vital signs: Reviewed and stable  Last Vitals:  Vitals Value Taken Time  BP 123/99 08/16/2017  9:52 AM  Temp    Pulse 86 08/16/2017  9:54 AM  Resp 18 08/16/2017  9:54 AM  SpO2 100 % 08/16/2017  9:54 AM  Vitals shown include unvalidated device data.  Last Pain:  Vitals:   08/16/17 0553  TempSrc: Oral      Patients Stated Pain Goal: 3 (32/54/98 2641)  Complications: No apparent anesthesia complications

## 2017-08-16 NOTE — Anesthesia Postprocedure Evaluation (Signed)
Anesthesia Post Note  Patient: James Buck  Procedure(s) Performed: LAPAROSCOPIC GASTRIC SLEEVE RESECTION WITH HIATAL HERNIA REPAIR, UPPER ENDOSCOPY (N/A Abdomen)     Patient location during evaluation: PACU Anesthesia Type: General Level of consciousness: awake and alert and oriented Pain management: pain level controlled Vital Signs Assessment: post-procedure vital signs reviewed and stable Respiratory status: spontaneous breathing, nonlabored ventilation, respiratory function stable and patient connected to nasal cannula oxygen Cardiovascular status: blood pressure returned to baseline and stable Postop Assessment: no apparent nausea or vomiting Anesthetic complications: no    Last Vitals:  Vitals:   08/16/17 1030 08/16/17 1045  BP: 124/83 125/85  Pulse: 80 81  Resp: 12 17  Temp:  37.1 C  SpO2: 94% 92%    Last Pain:  Vitals:   08/16/17 1045  TempSrc:   PainSc: 3                  James Buck A.

## 2017-08-16 NOTE — Op Note (Signed)
Preoperative diagnosis: laparoscopic sleeve gastrectomy  Postoperative diagnosis: Same   Procedure: Upper endoscopy   Surgeon: Clovis Riley, M.D.  Anesthesia: Gen.   Description of procedure: The endoscopy was placed in the mouth and into the oropharynx and under endoscopic vision it was advanced to the esophagogastric junction.  The pouch was insufflated and no bleeding or bubbles were seen.  The GEJ was identified at 40cm from the teeth. The z-line was slightly irregular consistent with history of reflux. There was no undue angulation or narrowing of the gastric lumen specifically at the incisura.  No bleeding or leaks were detected. The scope was withdrawn without difficulty.    Clovis Riley, M.D. General, Bariatric, & Minimally Invasive Surgery [facility]

## 2017-08-16 NOTE — Progress Notes (Signed)
Water and incentive spirometer started at 1500.

## 2017-08-16 NOTE — H&P (Signed)
James Buck Documented: 07/30/2017 11:19 AM Location: [facility] Patient #: [id number] DOB: 1960-07-17 Divorced / Language: James Buck / Race: White Male   History of Present Illness James Buck M. [name] Buck; 07/30/2017 2:30 PM) The patient is a 57 year old male who presents for a bariatric surgery evaluation. He comes in for long-term follow-up regarding his obesity. I initially met him in May 2018 to discuss weight loss surgery. He has completed supervised weight loss. He has also completed his bariatric surgery evaluation. He denies any medical changes since he was last seen. I saw him in Feb 2019 after completion of his SWL.  His comorbidities include diabetes mellitus, obstructive sleep apnea on CPAP, history of stage III sigmoid colon cancer 2007 with recurrence in mesentery 2012 with no evidence recurrence since then  He did undergo surveillance with the oncologist in the fall. His comprehensive metabolic panel and CBC were within normal limits. His total bilirubin was slightly elevated at 1.21. Blood glucose 158. CT scan of his chest and pelvis revealed no evidence of recurrent disease. 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  His upper GI was unremarkable. Most recent labs include an A1c of 7.7, normal lipid panel and normal vitamin B12 level. However his endocrinologist did check a A1C a few weeks ago and it was up since last check and was started on a 2nd oral diabetic med  denies any tobacco/drug use  OCTOBER 2018 He is referred by James Buck for evaluation of weight loss surgery. James Buck is his PCP. He completed our Neurosurgeon. He has struggled with his weight for several years. Despite several attempts for sustained weight loss he has been unsuccessful. He is tried calorie restriction, Atkins, and nutrition supervised programs-all without any long-term success. He is interested in improving his diabetes and losing weight  so it'll be easier to exercise.  Comorbidities include diabetes mellitus, obstructive sleep apnea on CPAP, history of stage III sigmoid colon cancer 2007 (T3N1, 2/16 LN,) with recurrence in mesentery 2012 with no evidence of recurrence since then  He denies any chest pain, chest pressure, orthopnea, paroxysmal nocturnal dyspnea, dyspnea on exertion. He denies any TIAs or amaurosis fugax. He denies any personal history of blood clots. However he did have a prior right subclavian catheter 2007 while being treated for colon cancer that developed SVC occlusion and underwent an SVC stent placement in 2012. This developed a fibrin sheath. They attempted to do stripping of the fibrin sheath in 2012. His port was eventually removed.  He reports rare reflux. He denies any abdominal pain. He underwent a colectomy with ostomy and 2007 due to colon cancer. He had a colostomy reversal later by James Buck. . He was found to have recurrent disease i in his mesentery status post resection followed by chemotherapy. This was done through April 2012 through September 2012. He is followed by oncology at [facility] long. He had a follow-up colonoscopy by James. In January 2017 that was unremarkable except for some hemorrhoids. He has a 5 year recall.  He has chronic right knee pain. He states that his last A1c was 7.7. He states that he is been a diabetic since around 2007. 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 He has tingling in his feet as well as some fatigue in his lower extremities. However he denies any TIAs or amaurosis fugax. He denies any tobacco use. He has 1 alcoholic beverage per week.  Problem List/Past Medical James Buck; 07/30/2017 2:33 PM) OSA ON CPAP (G47.33)  MORBID OBESITY, UNSPECIFIED OBESITY TYPE (E66.01)  I believe he would benefit from weight loss surgery given his cancer history, diabetes, and obstructive sleep apnea. Given his complicated intestinal  surgical history I think the safest procedure would be a sleeve gastrectomy.  Past Surgical History James Buck; 07/30/2017 2:33 PM) Cataract Surgery  Bilateral. Colon Removal - Partial   Diagnostic Studies History James Buck; 07/30/2017 2:33 PM) Colonoscopy  1-5 years ago  Allergies James Buck; 07/30/2017 2:33 PM) No Known Drug Allergies [06/03/2017]:  Medication History James Buck; 07/30/2017 2:33 PM) Pioglitazone HCl (15MG Tablet, Oral) Active. MetFORMIN HCl ER (500MG Tablet ER 24HR, Oral) Active. Medications Reconciled Trulicity (3.2DJ/2.4QA Soln Pen-inj, Subcutaneous) Active. Baby Aspirin (81MG Tablet Chewable, Oral) Active. Flonase (50MCG/ACT Suspension, Nasal) Active.  Social History James Buck; 07/30/2017 2:33 PM) Alcohol use  Occasional alcohol use. Caffeine use  Carbonated beverages, Coffee. Tobacco use  Former smoker.  Family History James Buck; 07/30/2017 2:33 PM) Cancer  Father. Hypertension  Mother. Melanoma  Mother.  Other Problems James Buck; 07/30/2017 2:33 PM) HISTORY OF COLON CANCER, STAGE IV (Z85.038)  DIABETES MELLITUS TYPE 2 IN OBESE (E11.69)   Vitals (James Buck CMA; 07/30/2017 11:19 AM) 07/30/2017 11:19 AM Weight: 255.8 lb Height: 69in Body Surface Area: 2.29 m Body Mass Index: 37.77 kg/m  Pulse: 86 (Regular)  BP: 138/80 (Sitting, Left Arm, Standard)       Physical Exam James Buck M. Tyleek Smick Buck; 07/30/2017 2:27 PM) General Mental Status-Alert. General Appearance-Consistent with stated age. Hydration-Well hydrated. Voice-Normal.  Head and Neck Head-normocephalic, atraumatic with no lesions or palpable masses. Trachea-midline. Thyroid Gland Characteristics - normal size and consistency.  Eye Eyeball - Bilateral-Extraocular movements intact. Sclera/Conjunctiva - Bilateral-No scleral icterus.  ENMT Ears -Note: normal ears.  Mouth and  Throat -Note: lips intact.   Chest and Lung Exam Chest and lung exam reveals -quiet, even and easy respiratory effort with no use of accessory muscles and on auscultation, normal breath sounds, no adventitious sounds and normal vocal resonance. Inspection Chest Wall - Normal. Back - normal.  Breast - Did not examine.  Cardiovascular Cardiovascular examination reveals -normal heart sounds, regular rate and rhythm with no murmurs and normal pedal pulses bilaterally.  Abdomen Inspection Inspection of the abdomen reveals - No Hernias. Skin - Scar - Note: He is a lower midline incision as well as a transverse left mid abdominal incision. He has diastases of his lower midline. Palpation/Percussion Palpation and Percussion of the abdomen reveal - Soft, Non Tender, No Rebound tenderness, No Rigidity (guarding) and No hepatosplenomegaly. Auscultation Auscultation of the abdomen reveals - Bowel sounds normal.  Peripheral Vascular Upper Extremity Palpation - Pulses bilaterally normal.  Neurologic Neurologic evaluation reveals -alert and oriented x 3 with no impairment of recent or remote memory. Mental Status-Normal.  Neuropsychiatric The patient's mood and affect are described as -normal. Judgment and Insight-insight is appropriate concerning matters relevant to self.  Musculoskeletal Normal Exam - Left-Upper Extremity Strength Normal and Lower Extremity Strength Normal. Normal Exam - Right-Upper Extremity Strength Normal and Lower Extremity Strength Normal. Note: Bilateral knee crepitus   Lymphatic Head & Neck  General Head & Neck Lymphatics: Bilateral - Description - Normal. Axillary - Did not examine. Femoral & Inguinal - Did not examine.    Assessment & Plan James Buck M. Malea Swilling Buck; 07/30/2017 2:33 PM) MORBID OBESITY, UNSPECIFIED OBESITY  TYPE (E66.01) Story: I believe he would benefit from weight loss surgery given his cancer history, diabetes, and  obstructive sleep apnea. Given his complicated intestinal surgical history I think the safest procedure would be a sleeve gastrectomy. Impression: We reviewed his preoperative workup. We discussed the importance of trying to get his diabetes a little bit better under control. We rediscussed the preoperative and postoperative care. We did discuss the possibility that reflux, heartburn could occur long-term after sleeve gastrectomy. We also discussed that given his 3 extensive prior abdominal surgeries that he is at increased risk for enterotomy. However at this point I don't think it is risk prohibitive. We did discuss that if dense scar tissue was found he leaves it up to me to make an intraoperative decision of how to proceed. We did discuss that should a extensive enterotomy be made requiring intestinal resection that we would not proceed with sleeve gastrectomy because of the increased risk of leak. He voiced understanding. We discussed the importance of the preoperative education class. All of his questions were asked and answered. Current Plans TSH (THYROID STIMULATING HORMONE) (67893) Pt Education - EMW_preopbariatric OSA ON CPAP (G47.33) DIABETES MELLITUS TYPE 2 IN OBESE (E11.69) Impression: We discussed a gastric bypass would probably give him the most benefit long-term because of his chronic diabetes however because of his numerous lower abdominal surgeries I believe a sleeve gastrectomy would be technically safer. HISTORY OF COLON CANCER, STAGE IV (Z85.038) Impression: clinically no evid of disease.  Leighton Ruff. Redmond Pulling, Buck, FACS General, Bariatric, & Minimally Invasive Surgery [facility], [place]

## 2017-08-16 NOTE — Progress Notes (Signed)
Pt had a bari tray which brought his total of clear liquids to 15.75 ounces. Protein was started. Pt doing well.  He has voided, ambulated numerous times, no major complaints of pain, and tolerating his liquids.  Will continue to monitor.

## 2017-08-16 NOTE — Op Note (Signed)
08/16/2017 Benjamine Mola 12-Jan-1961 [id number]   PRE-OPERATIVE DIAGNOSIS:    Morbid obesity (BMI 37)   Diabetes mellitus without complication (HCC)   OSA on CPAP   History of colon cancer GERD  POST-OPERATIVE DIAGNOSIS:  Same + hiatal hernia   PROCEDURE:  Procedure(s): LAPAROSCOPIC SLEEVE GASTRECTOMY WITH HIATAL HERNIA REPAIR UPPER GI ENDOSCOPY  SURGEON:  Surgeon(s): Gayland Curry, MD FACS FASMBS  ASSISTANTS: Romana Juniper MD   ANESTHESIA:   general  DRAINS: none   BOUGIE: 40 fr ViSiGi  LOCAL MEDICATIONS USED:   Exparel  EBL: 20cc  SPECIMEN:  Source of Specimen:  Greater curvature of stomach  DISPOSITION OF SPECIMEN:  PATHOLOGY  COUNTS:  YES  INDICATION FOR PROCEDURE: This is a very pleasant 57 y.o.-year-old morbidly obese male who has had unsuccessful attempts for sustained weight loss. The patient presents today for a planned laparoscopic sleeve gastrectomy with upper endoscopy. We have discussed the risk and benefits of the procedure extensively preoperatively. Please see my separate notes.  PROCEDURE: After obtaining informed consent and receiving 5000 units of subcutaneous heparin, the patient was brought to the operating room at [facility] and placed supine on the operating room table. General endotracheal anesthesia was established. Sequential compression devices were placed. A orogastric tube was placed. The patient's abdomen was prepped and draped in the usual standard surgical fashion. The patient received preoperative IV antibiotic. A surgical timeout was performed. ERAS protocol used.   Access to the abdomen was achieved using a 5 mm 0 laparoscope thru a 5 mm trocar In the right upper Quadrant 2 fingerbreadths below the right subcostal margin using the Optiview technique.  I chose the right upper quadrant because the patient had had a prior sigmoid colectomy with Hartman's procedure with later reversal and then subsequent exploratory laparotomy with small  bowel resection.  Pneumoperitoneum was smoothly established up to 15 mm of mercury. The laparoscope was advanced and the abdominal cavity was surveilled.  There were omental adhesions to the upper midline extending down the lower midline.  Most of these were thin omental adhesions but there were some loops of small bowel tethered near the abdominal wall.  I placed an additional 5 mm trocar in the right lateral abdominal wall under direct visualization.  Using a combination of Endo Shears without electrocautery along with harmonic scalpel I took down the omental adhesions just  to above the umbilicus.  This revealed 2 small Swiss cheese defects of around 1 cm.  The patient was then placed in reverse Trendelenburg.   A 5 mm trocar was placed slightly above and to the left of the umbilicus under direct visualization.  The [facility] liver retractor was placed under the left lobe of the liver through a 5 mm trocar incision site in the subxiphoid position. A 5 mm trocar was placed in the lateral left upper quadrant along with a 15 mm trocar in the mid right abdomen. A final 5 mm trocar was placed in the lateral LUQ.  All under direct visualization after exparel had been infiltrated in bilateral lateral upper abdominal walls as a TAP block.  The stomach was inspected. It was completely decompressed and the orogastric tube was removed.  There was a fair amount of perigastric fat tissue near the hiatus.  The patient does have a history of reflux but his preoperative upper GI showed no evidence of a hiatal hernia but I decided to test him intraoperatively for a hiatal hernia.  The calibration tube was placed in the  oropharynx and guided down into the stomach by the CRNA. 10 mL of air was insufflated into the calibration balloon. The calibration tubing was then gently pulled back by the CRNA and it slid past the GE junction. At this point the calibration tubing was desufflated and pulled back into the esophagus. This  confirmed my suspicion of a clinically significant hiatal hernia. The gastrohepatic ligament was incised with harmonic scalpel. The right crus was identified. We identified the crossing fat along the right crus. The adipose tissue just above this area was incised with harmonic scalpel. I then bluntly dissected out this area and identified the left crus. There was evidence of a hiatal hernia. I then mobilized the esophagus. The left and right crus were further mobilized with blunt dissection. I was then able to reapproximate the left and right crus with 0 Ethibond using an Endostitch suture device and securing it with a titanium tyknot.  We then had the CRNA readvanced the calibration tubing back into the stomach. 10 mL of air was insufflated into the calibration tube balloon. The calibration tube was then gently pulled back and there was resistance at the GE junction. The tube did not slide back up into the esophagus. At this point the calibration tubing was deflated and removed from the patient's body.   We identified the pylorus and measured 6 cm proximal to the pylorus and identified an area of where we would start taking down the short gastric vessels. Harmonic scalpel was used to take down the short gastric vessels along the greater curvature of the stomach. We were able to enter the lesser sac. We continued to march along the greater curvature of the stomach taking down the short gastrics. As we approached the gastrosplenic ligament we took care in this area not to injure the spleen. We were able to take down the entire gastrosplenic ligament. We then mobilized the fundus away from the left crus of diaphragm. There were a few posterior gastric avascular attachments. This left the stomach completely mobilized. No vessels had been taken down along the lesser curvature of the stomach.  We then reidentified the pylorus. A 40Fr ViSiGi was then placed in the oropharynx and advanced down into the stomach and  placed in the distal antrum and positioned along the lesser curvature. It was placed under suction which secured the 40Fr ViSiGi in place along the lesser curve.  His antrum appeared a little bit thick so therefore I decided to start with a black load.  Then using the Ethicon echelon 60 mm stapler with a black load with Seamguard, I placed a stapler along the antrum approximately 5 cm from the pylorus. The stapler was angled so that there is ample room at the angularis incisura. I then fired the first staple load after inspecting it posteriorly to ensure adequate space both anteriorly and posteriorly. At this point I still was not completely past the angularis so with a green load with Seamguard, I placed the stapler in position just inside the prior stapleline. We then rotated the stomach to insure that there was adequate anteriorly as well as posteriorly. The stapler was then fired.  At this point I started using 60 mm gold load staple cartridge x1 with Seamguard. The echelon stapler was then repositioned with a 60 mm blue load with Seamguard and we continued to march up along the [place]. My assistant was holding traction along the greater curvature stomach along the cauterized short gastric vessels ensuring that the stomach was symmetrically  retracted. Prior to each firing of the staple, we rotated the stomach to ensure that there is adequate stomach left.  As we approached the fundus, I used 60 mm blue cartridge with Seamguard aiming  lateral to the GE junction after mobilizing some of the esophageal fat pad.  The sleeve was inspected. There is no evidence of cork screw. The staple line appeared hemostatic. The CRNA inflated the ViSiGi to the green zone and the upper abdomen was flooded with saline. There were no bubbles. The sleeve was decompressed and the ViSiGi removed. My assistant scrubbed out and performed an upper endoscopy. The sleeve easily distended with air and the scope was easily advanced to the  pylorus. There is no evidence of internal bleeding or cork screwing. There was no narrowing at the angularis. There is no evidence of bubbles. Please see his operative note for further details. The gastric sleeve was decompressed and the endoscope was removed.  The greater curvature the stomach was grasped with a laparoscopic grasper and removed from the 15 mm trocar site.  The liver retractor was removed. I then closed the 15 mm trocar site with 1 interrupted 0 Vicryl sutures through the fascia using the endoclose. The closure was viewed laparoscopically and it was airtight. Remaining Exparel was then infiltrated in the preperitoneal spaces around the trocar sites. Pneumoperitoneum was released. All trocar sites were closed with a 4-0 Monocryl in a subcuticular fashion followed by the application of benzoin, steri-strips, and bandaids. The patient was extubated and taken to the recovery room in stable condition. All needle, instrument, and sponge counts were correct x2. There are no immediate complications  (1) 60 mm black with Seamguard (1) 60 mm green with Seamguard (1) 60 mm gold with seamguard (3) 60 mm blue with 2 seamguard  PLAN OF CARE: Admit to inpatient   PATIENT DISPOSITION:  PACU - hemodynamically stable.   Delay start of Pharmacological VTE agent (>24hrs) due to surgical blood loss or risk of bleeding:  no  Leighton Ruff. Redmond Pulling, MD, FACS FASMBS General, Bariatric, & Minimally Invasive Surgery [facility], [place]

## 2017-08-16 NOTE — Anesthesia Procedure Notes (Signed)
Procedure Name: Intubation Date/Time: 08/16/2017 7:32 AM Performed by: Montel Clock, CRNA Pre-anesthesia Checklist: Patient identified, Emergency Drugs available, Suction available, Patient being monitored and Timeout performed Patient Re-evaluated:Patient Re-evaluated prior to induction Oxygen Delivery Method: Circle system utilized Preoxygenation: Pre-oxygenation with 100% oxygen Induction Type: IV induction, Rapid sequence and Cricoid Pressure applied Laryngoscope Size: Mac and 3 Grade View: Grade II Tube type: Oral Tube size: 7.5 mm Number of attempts: 1 Airway Equipment and Method: Stylet Placement Confirmation: ETT inserted through vocal cords under direct vision,  positive ETCO2 and breath sounds checked- equal and bilateral Secured at: 22 cm Tube secured with: Tape Dental Injury: Teeth and Oropharynx as per pre-operative assessment

## 2017-08-17 LAB — CBC WITH DIFFERENTIAL/PLATELET
BASOS ABS: 0 10*3/uL (ref 0.0–0.1)
Basophils Relative: 0 %
EOS PCT: 1 %
Eosinophils Absolute: 0.1 10*3/uL (ref 0.0–0.7)
HCT: 41.9 % (ref 39.0–52.0)
Hemoglobin: 14 g/dL (ref 13.0–17.0)
LYMPHS ABS: 1.2 10*3/uL (ref 0.7–4.0)
LYMPHS PCT: 11 %
MCH: 32.4 pg (ref 26.0–34.0)
MCHC: 33.4 g/dL (ref 30.0–36.0)
MCV: 97 fL (ref 78.0–100.0)
Monocytes Absolute: 1 10*3/uL (ref 0.1–1.0)
Monocytes Relative: 9 %
Neutro Abs: 8.5 10*3/uL — ABNORMAL HIGH (ref 1.7–7.7)
Neutrophils Relative %: 79 %
PLATELETS: 225 10*3/uL (ref 150–400)
RBC: 4.32 MIL/uL (ref 4.22–5.81)
RDW: 12.9 % (ref 11.5–15.5)
WBC: 10.7 10*3/uL — ABNORMAL HIGH (ref 4.0–10.5)

## 2017-08-17 LAB — COMPREHENSIVE METABOLIC PANEL
ALT: 99 U/L — ABNORMAL HIGH (ref 17–63)
AST: 68 U/L — ABNORMAL HIGH (ref 15–41)
Albumin: 3.8 g/dL (ref 3.5–5.0)
Alkaline Phosphatase: 34 U/L — ABNORMAL LOW (ref 38–126)
Anion gap: 10 (ref 5–15)
BILIRUBIN TOTAL: 2 mg/dL — AB (ref 0.3–1.2)
BUN: 12 mg/dL (ref 6–20)
CALCIUM: 8.7 mg/dL — AB (ref 8.9–10.3)
CHLORIDE: 106 mmol/L (ref 101–111)
CO2: 23 mmol/L (ref 22–32)
Creatinine, Ser: 0.84 mg/dL (ref 0.61–1.24)
Glucose, Bld: 163 mg/dL — ABNORMAL HIGH (ref 65–99)
POTASSIUM: 4 mmol/L (ref 3.5–5.1)
Sodium: 139 mmol/L (ref 135–145)
TOTAL PROTEIN: 6.5 g/dL (ref 6.5–8.1)

## 2017-08-17 LAB — GLUCOSE, CAPILLARY
GLUCOSE-CAPILLARY: 152 mg/dL — AB (ref 65–99)
GLUCOSE-CAPILLARY: 151 mg/dL — AB (ref 65–99)
Glucose-Capillary: 140 mg/dL — ABNORMAL HIGH (ref 65–99)

## 2017-08-17 MED ORDER — PANTOPRAZOLE SODIUM 40 MG PO TBEC: 40.0000 mg | DELAYED_RELEASE_TABLET | Freq: Every day | ORAL | 1 refills | Status: AC

## 2017-08-17 MED ORDER — ONDANSETRON HCL 4 MG PO TABS: 4.0000 mg | ORAL_TABLET | Freq: Three times a day (TID) | ORAL | 0 refills | Status: DC | PRN

## 2017-08-17 MED ORDER — INSULIN LISPRO 100 UNIT/ML (KWIKPEN): 0.0000 [IU] | PEN_INJECTOR | Freq: Three times a day (TID) | SUBCUTANEOUS | 11 refills | Status: DC

## 2017-08-17 MED ORDER — ACETAMINOPHEN 325 MG PO TABS: 650.0000 mg | ORAL_TABLET | ORAL | 2 refills | Status: AC | PRN

## 2017-08-17 MED ORDER — INSULIN PEN NEEDLE 31G X 5 MM MISC: 1.0000 "pen " | Freq: Three times a day (TID) | 1 refills | Status: DC

## 2017-08-17 MED ORDER — OXYCODONE HCL 5 MG/5ML PO SOLN: 5.0000 mg | Freq: Four times a day (QID) | ORAL | 0 refills | Status: DC | PRN

## 2017-08-17 NOTE — Progress Notes (Signed)
Inpatient Diabetes Program Recommendations  AACE/ADA: New Consensus Statement on Inpatient Glycemic Control (2015)  Target Ranges:  Prepandial:   less than 140 mg/dL      Peak postprandial:   less than 180 mg/dL (1-2 hours)      Critically ill patients:  140 - 180 mg/dL   Lab Results  Component Value Date   GLUCAP 151 (H) 08/17/2017   HGBA1C 8.6 (H) 08/02/2017    Review of Glycemic Control  Diabetes history: DM2 Outpatient Diabetes medications: Trulicity 1.5 mg Q weekly, metformin 2000 mg QHS, Actos 15 mg QD Current orders for Inpatient glycemic control: Novolog 0-20 units Q4H  Inpatient Diabetes Program Recommendations:     Check blood sugars 4-6x/day and call Endo with results.  Novolog 0-20 units tidwc  Gave information about the [employer] Continuous Glucose Monitoring system. Encouraged pt to speak with his endo about it.  Discussed above with RN and MD.  Thank you. Lorenda Peck, RD, LDN, CDE Inpatient Diabetes Coordinator [phone]

## 2017-08-17 NOTE — Progress Notes (Signed)
Discharge instructions reviewed with patient. Questions answered and patient denies further questions. Donne Hazel, RN

## 2017-08-17 NOTE — Discharge Instructions (Signed)
For blood sugars: 0 to 120           give 0 units Novolog 121 to 150       Give 3 units Novolog 151 to 200       Give 4 units Novolog 201 to 250       Give 7 units 251 to 300       Give  11 units 301 to 350       Give 15 units 351 to 400       Give 20 units >400                Call Doctor        GASTRIC BYPASS/SLEEVE  Home Care Instructions   These instructions are to help you care for yourself when you go home.  Call: If you have any problems.  Call [phone] and ask for the surgeon on call  If you need immediate help, come to the ER at [facility].   Tell the ER staff that you are a new post-op gastric bypass or gastric sleeve patient   Signs and symptoms to report:  Severe vomiting or nausea o If you cannot keep down clear liquids for longer than 1 day, call your surgeon   Abdominal pain that does not get better after taking your pain medication  Fever over 100.4 F with chills  Heart beating over 100 beats a minute  Shortness of breath at rest  Chest pain   Redness, swelling, drainage, or foul odor at incision (surgical) sites   If your incisions open or pull apart  Swelling or pain in calf (lower leg)  Diarrhea (Loose bowel movements that happen often), frequent watery, uncontrolled bowel movements  Constipation, (no bowel movements for 3 days) if this happens: Pick one o Milk of Magnesia, 2 tablespoons by mouth, 3 times a day for 2 days if needed o Stop taking Milk of Magnesia once you have a bowel movement o Call your doctor if constipation continues Or o Miralax  (instead of Milk of Magnesia) following the label instructions o Stop taking Miralax once you have a bowel movement o Call your doctor if constipation continues  Anything you think is not normal   Normal side effects after surgery:  Unable to sleep at night or unable to focus  Irritability or moody  Being tearful (crying) or depressed These are common complaints, possibly  related to your anesthesia medications that put you to sleep, stress of surgery, and change in lifestyle.  This usually goes away a few weeks after surgery.  If these feelings continue, call your primary care doctor.   Wound Care: You may have surgical glue, steri-strips, or staples over your incisions after surgery  Surgical glue:  Looks like a clear film over your incisions and will wear off a little at a time  Steri-strips: Strips of tape over your incisions. You may notice a yellowish color on the skin under the steri-strips. This is used to make the   steri-strips stick better. Do not pull the steri-strips off - let them fall off  Staples: Staples may be removed before you leave the hospital o If you go home with staples, call [facility], [phone] at for an appointment with your surgeons nurse to have staples removed 10 days after surgery.  Showering: You may shower two (2) days after your surgery unless your surgeon tells you differently o Wash gently around incisions with warm soapy water, rinse  well, and gently pat dry  o No tub baths until staples are removed, steri-strips fall off or glue is gone.    Medications:  Medications should be liquid or crushed if larger than the size of a dime  Extended release pills (medication that release a little bit at a time through the day) should NOT be crushed or cut. (examples include XL, ER, DR, SR)  Depending on the size and number of medications you take, you may need to space (take a few throughout the day)/change the time you take your medications so that you do not over-fill your pouch (smaller stomach)  Make sure you follow-up with your primary care doctor to make medication changes needed during rapid weight loss and life-style changes  If you have diabetes, follow up with the doctor that orders your diabetes medication(s) within one week after surgery and check your blood sugar regularly.  Do not drive while taking  prescription pain medication   It is ok to take Tylenol by the bottle instructions with your pain medicine or instead of your pain medicine as needed.  DO NOT TAKE NSAIDS (EXAMPLES OF NSAIDS:  IBUPROFREN/ NAPROXEN)  Diet:                    First 2 Weeks  You will see the dietician t about two (2) weeks after your surgery. The dietician will increase the types of foods you can eat if you are handling liquids well:  If you have severe vomiting or nausea and cannot keep down clear liquids lasting longer than 1 day, call your surgeon @ [phone]) Protein Shake  Drink at least 2 ounces of shake 5-6 times per day  Each serving of protein shakes (usually 8 - 12 ounces) should have: o 15 grams of protein  o And no more than 5 grams of carbohydrate   Goal for protein each day: o Men = 80 grams per day o Women = 60 grams per day  Protein powder may be added to fluids such as non-fat milk or Lactaid milk or unsweetened Soy/Almond milk (limit to 35 grams added protein powder per serving)  Hydration  Slowly increase the amount of water and other clear liquids as tolerated (See Acceptable Fluids)  Slowly increase the amount of protein shake as tolerated    Sip fluids slowly and throughout the day.  Do not use straws.  May use sugar substitutes in small amounts (no more than 6 - 8 packets per day; i.e. Splenda)  Fluid Goal  The first goal is to drink at least 8 ounces of protein shake/drink per day (or as directed by the nutritionist); some examples of protein shakes are Johnson [employer] Johnson, AMR Corporation, EAS Edge HP, and Unjury. See handout from pre-op Bariatric Education Class: o Slowly increase the amount of protein shake you drink as tolerated o You may find it easier to slowly sip shakes throughout the day o It is important to get your proteins in first  Your fluid goal is to drink 64 - 100 ounces of fluid daily o It may take a few weeks to build up to this  32 oz (or more) should  be clear liquids  And   32 oz (or more) should be full liquids (see below for examples)  Liquids should not contain sugar, caffeine, or carbonation  Clear Liquids:  Water or Sugar-free flavored water (i.e. Fruit H2O, Propel)  Decaffeinated coffee or tea (sugar-free)  Crystal Lite, BJ's, Minute Barnes [employer] Noble  Lite  Sugar-free Jell-O  Bouillon or broth  Sugar-free Popsicle:   *Less than 20 calories each; Limit 1 per day  Full Liquids: Protein Shakes/Drinks + 2 choices per day of other full liquids  Full liquids must be: o No More Than 15 grams of Carbs per serving  o No More Than 3 grams of Fat per serving  Strained low-fat cream soup (except Cream of Potato or Tomato)  Non-Fat milk  Fat-free Lactaid Milk  Unsweetened Soy Or Unsweetened Almond Milk  Low Sugar yogurt (Dannon Lite & Fit, Greek yogurt; Oikos Triple Zero; Chobani Simply 100; Yoplait 100 calorie [place] - No Fruit on the Bottom)    Vitamins and Minerals  Start 1 day after surgery unless otherwise directed by your surgeon  2 Chewable Bariatric Specific Multivitamin / Multimineral Supplement with iron (Example: Bariatric Advantage Multi EA)  Chewable Calcium with Vitamin D-3 (Example: 3 Chewable Calcium Plus 600 with Vitamin D-3) o Take 500 mg three (3) times a day for a total of 1500 mg each day o Do not take all 3 doses of calcium at one time as it may cause constipation, and you can only absorb 500 mg  at a time  o Do not mix multivitamins containing iron with calcium supplements; take 2 hours apart  Menstruating women and those with a history of anemia (a blood disease that causes weakness) may need extra iron o Talk with your doctor to see if you need more iron  Do not stop taking or change any vitamins or minerals until you talk to your dietitian or surgeon  Your Dietitian and/or surgeon must approve all vitamin and mineral supplements   Activity and Exercise: Limit your physical activity as  instructed by your doctor.  It is important to continue walking at home.  During this time, use these guidelines:  Do not lift anything greater than ten (10) pounds for at least two (2) weeks  Do not go back to work or drive until Engineer, production says you can  You may have sex when you feel comfortable  o It is VERY important for male patients to use a reliable birth control method; fertility often increases after surgery  o All hormonal birth control will be ineffective for 30 days after surgery due to medications given during surgery a barrier method must be used. o Do not get pregnant for at least 18 months  Start exercising as soon as your doctor tells you that you can o Make sure your doctor approves any physical activity  Start with a simple walking program  Walk 5-15 minutes each day, 7 days per week.   Slowly increase until you are walking 30-45 minutes per day Consider joining our [place] program. [phone] or email [email or web address]   Special Instructions Things to remember:  Use your CPAP when sleeping if this applies to you   [facility] has two free Bariatric Surgery Support Groups that meet monthly o The 3rd Thursday of each month, 6 pm, [facility]  o The 2nd Friday of each month, 11:45 am in the private dining room in the basement of [place]  It is very important to keep all follow up appointments with your surgeon, dietitian, primary care physician, and behavioral health practitioner  Routine follow up schedule with your surgeon include appointments at 2-3 weeks, 6-8 weeks, 6 months, and 1 year at a minimum.  Your surgeon may request to see you more often.   o After  the first year, please follow up with your bariatric surgeon and dietitian at least once a year in order to maintain best weight loss results [facility]: [place]: [phone] Bariatric Nurse  Coordinator: [phone]      Reviewed and Endorsed  by [facility] Patient Education Committee, June, 2016 Edits Approved: Aug, 2018

## 2017-08-17 NOTE — Discharge Summary (Signed)
Physician Discharge Summary  James Buck SLH:[id number] DOB: [birth year] DOA: 08/16/2017  PCP: Aretta Nip, MD  Admit date: 08/16/2017 Discharge date: 08/17/2017  Recommendations for Outpatient Follow-up:   Follow-up Information    Greer Pickerel, MD. Go on 09/08/2017.   Specialty:  General Surgery Why:  at [address] information: [place] [address] [place] [address] [phone]        Greer Pickerel, MD .   Specialty:  General Surgery Contact information: [place] [address] [phone]          Discharge Diagnoses:  Principal Problem:   Morbid obesity ([place]) Active Problems:   Diabetes mellitus without complication (HCC)   OSA on CPAP   History of colon cancer   Surgical Procedure: Laparoscopic Sleeve Gastrectomy with hiatal hernia repair, upper endoscopy  Discharge Condition: Good Disposition: Home  Diet recommendation: Postoperative sleeve gastrectomy diet (liquids only)  Filed Weights   08/16/17 0555 08/17/17 0700  Weight: 115.8 kg (255 lb 6.4 oz) 106.8 kg (235 lb 7.2 oz)     Hospital Course:  The patient was admitted for a planned laparoscopic sleeve gastrectomy. Please see operative note. Preoperatively the patient was given 5000 units of subcutaneous heparin for DVT prophylaxis. Postoperative prophylactic Lovenox dosing was started on the evening of postoperative day 0. ERAS protocol was used. On the evening of postoperative day 0, the patient was started on water and ice chips. On postoperative day 1 the patient had no fever or tachycardia and was tolerating water in their diet was gradually advanced throughout the day. The patient was ambulating without difficulty. Their vital signs are stable without fever or tachycardia. Their hemoglobin had remained stable. The patient was maintained on their home settings for CPAP therapy. The patient had received discharge instructions and counseling. They were deemed stable  for discharge and had met discharge criteria. He received education from the diabetes coordinator. We switched him to a sliding scale regimen until he has follow up with his PCP  BP (!) 151/80 (BP Location: Right Arm)   Pulse 72   Temp 97.7 F (36.5 C) (Oral)   Resp 18   Ht '5\' 9"'  (1.753 m)   Wt 106.8 kg (235 lb 7.2 oz)   SpO2 98%   BMI 34.77 kg/m   Gen: alert, NAD, non-toxic appearing Pupils: equal, no scleral icterus Pulm: Lungs clear to auscultation, symmetric chest rise CV: regular rate and rhythm Abd: soft, mild approp tender, nondistended.. No cellulitis. No incisional hernia Ext: no edema, no calf tenderness Skin: no rash, no jaundice    Discharge Instructions  Discharge Instructions    Ambulate hourly while awake   Complete by:  As directed    Call MD for:  difficulty breathing, headache or visual disturbances   Complete by:  As directed    Call MD for:  persistant dizziness or light-headedness   Complete by:  As directed    Call MD for:  persistant nausea and vomiting   Complete by:  As directed    Call MD for:  redness, tenderness, or signs of infection (pain, swelling, redness, odor or green/yellow discharge around incision site)   Complete by:  As directed    Call MD for:  severe uncontrolled pain   Complete by:  As directed    Call MD for:  temperature >101 F   Complete by:  As directed    Diet bariatric full liquid   Complete by:  As  directed    Discharge instructions   Complete by:  As directed    See bariatric discharge instructions   Incentive spirometry   Complete by:  As directed    Perform hourly while awake     Allergies as of 08/17/2017      Reactions   Statins Other (See Comments)   Body aches   Victoza [liraglutide] Nausea Only   Prior nausea when taken in AM (tolerates bedtime dosing with no difficulty)      Medication List    STOP taking these medications   aspirin 81 MG tablet   metFORMIN 500 MG 24 hr tablet Commonly known as:   GLUCOPHAGE-XR   pioglitazone 15 MG tablet Commonly known as:  ACTOS   TRULICITY 1.5 OI/3.2PQ Sopn Generic drug:  Dulaglutide     TAKE these medications   acetaminophen 325 MG tablet Commonly known as:  TYLENOL Take 2 tablets (650 mg total) by mouth every 4 (four) hours as needed.   fluticasone 50 MCG/ACT nasal spray Commonly known as:  FLONASE Place 2 sprays into the nose daily. Reported on 04/24/2015   glucose blood test strip 1 each. Use as instructed. Check cbg q am, alternatinf between AM and PM mels. AC   QID   insulin lispro 100 UNIT/ML KiwkPen Commonly known as:  HUMALOG KWIKPEN Inject 0-0.2 mLs (0-20 Units total) into the skin 3 (three) times daily. See sliding scale instruction   Insulin Pen Needle 31G X 5 MM Misc 1 pen by Does not apply route 3 (three) times daily.   multivitamin with minerals Tabs tablet Take 1 tablet by mouth daily.   ondansetron 4 MG tablet Commonly known as:  ZOFRAN Take 1 tablet (4 mg total) by mouth every 8 (eight) hours as needed for nausea or vomiting.   oxyCODONE 5 MG/5ML solution Commonly known as:  ROXICODONE Take 5 mLs (5 mg total) by mouth every 6 (six) hours as needed for severe pain.   oxymetazoline 0.05 % nasal spray Commonly known as:  AFRIN Place 1 spray into both nostrils 2 (two) times daily as needed for congestion.   pantoprazole 40 MG tablet Commonly known as:  PROTONIX Take 1 tablet (40 mg total) by mouth daily.   pseudoephedrine 30 MG tablet Commonly known as:  SUDAFED Take 30 mg by mouth every 4 (four) hours as needed for congestion.   Vitamin D3 5000 units Caps Take by mouth. Reported on 04/24/2015      Follow-up Information    Greer Pickerel, MD. Go on 09/08/2017.   Specialty:  General Surgery Why:  at [address] information: [place] [address] [phone]        Greer Pickerel, MD .   Specialty:  General Surgery Contact information: [address] [place]  [address] [phone]          For blood sugars: 0 to 120           give 0 units Novolog 121 to 150       Give 3 units 151 to 200       Give 4 units 201 to 250       Give 7 units 251 to 300       Give  11 units 301 to 350       Give 15 units 351 to 400       Give 20 units >400  Call Doctor   The results of significant diagnostics from this hospitalization (including imaging, microbiology, ancillary and laboratory) are listed below for reference.    Significant Diagnostic Studies: No results found.  Labs: Basic Metabolic Panel: Recent Labs  Lab 08/17/17 0448  NA 139  K 4.0  CL 106  CO2 23  GLUCOSE 163*  BUN 12  CREATININE 0.84  CALCIUM 8.7*   Liver Function Tests: Recent Labs  Lab 08/17/17 0448  AST 68*  ALT 99*  ALKPHOS 34*  BILITOT 2.0*  PROT 6.5  ALBUMIN 3.8    CBC: Recent Labs  Lab 08/16/17 1015 08/17/17 0448  WBC  --  10.7*  NEUTROABS  --  8.5*  HGB 14.3 14.0  HCT 42.9 41.9  MCV  --  97.0  PLT  --  225    CBG: Recent Labs  Lab 08/16/17 1641 08/16/17 1950 08/17/17 0018 08/17/17 0337 08/17/17 0730  GLUCAP 206* 224* 140* 152* 151*    Principal Problem:   Morbid obesity ([place]) Active Problems:   Diabetes mellitus without complication ([place])   OSA on CPAP   History of colon cancer   Time coordinating discharge: 20 min  Signed:  Gayland Curry, MD [facility], [place] [phone] 08/17/2017, 10:06 AM

## 2017-08-17 NOTE — Progress Notes (Signed)
Patient alert and oriented, Post op day 1.  Provided support and encouragement.  Encouraged pulmonary toilet, ambulation and small sips of liquids. Completed 12 ounces of clear fluids overnight along with protein shake.   All questions answered.  Will continue to monitor.

## 2017-08-17 NOTE — Progress Notes (Signed)
Patient alert and oriented, pain is controlled. Patient is tolerating fluids, advanced to protein shake today, patient is tolerating well.  Reviewed Gastric sleeve discharge instructions with patient and patient is able to articulate understanding.  Provided information on BELT program, Support Group and [facility] outpatient pharmacy. All questions answered, will continue to monitor.   Total 24 hour fluid intake 1060 Per Dehydration Protocol call back one week post op

## 2017-08-17 NOTE — Plan of Care (Signed)

## 2017-08-23 ENCOUNTER — Telehealth (HOSPITAL_COMMUNITY): Payer: Self-pay

## 2017-08-23 NOTE — Telephone Encounter (Signed)
Patient called to discuss post bariatric surgery follow up questions.  Unable to establish contact, contact information provided. Await return phone call to answer post surgery questions.  See below:   1.  Tell me about your pain and pain management?  2.  Let's talk about fluid intake.  How much total fluid are you taking in?  3.  How much protein have you taken in the last 2 days?  4.  Have you had nausea?  Tell me about when have experienced nausea and what you did to help?  5.  Has the frequency or color changed with your urine?  6.  Tell me what your incisions look like?  7.  Have you been passing gas? BM?  8.  If a problem or question were to arise who would you call?  Do you know contact numbers for [place], [facility], and [facility]?  9.  How has the walking going?  10.  How are your vitamins and calcium going?  How are you taking them?

## 2017-08-31 ENCOUNTER — Encounter: Payer: BLUE CROSS/BLUE SHIELD | Attending: General Surgery | Admitting: Registered"

## 2017-08-31 DIAGNOSIS — Z713 Dietary counseling and surveillance: Secondary | ICD-10-CM | POA: Diagnosis not present

## 2017-08-31 DIAGNOSIS — Z6834 Body mass index (BMI) 34.0-34.9, adult: Secondary | ICD-10-CM | POA: Diagnosis not present

## 2017-08-31 DIAGNOSIS — E119 Type 2 diabetes mellitus without complications: Secondary | ICD-10-CM

## 2017-09-01 NOTE — Progress Notes (Signed)
Bariatric Class:  Appt start time: 1530 end time:  1630.  2 Week Post-Operative Nutrition Class  Patient was seen on 08/31/2017 for Post-Operative Nutrition [facility].   Surgery date: 08/16/2017 Surgery type: Sleeve Start weight at [facility]: 249.5 Weight today: 237.2 Weight change: 12.3  Pt states he is consuming about 64 ounces of fluid and 80 grams of protein a day. Pt states he checks BS 3x/day (averaging 150). Pt states he has experienced gas and loose stools.   TANITA  BODY COMP RESULTS  08/31/2017   BMI (kg/m^2) 35.0   Fat Mass (lbs) 93.6   Fat Free Mass (lbs) 143.6   Total Body Water (lbs) 107.8   The following the learning objectives were met by the patient during this course:  Identifies Phase 3A (Soft, High Proteins) Dietary Goals and will begin from 2 weeks post-operatively to 2 months post-operatively  Identifies appropriate sources of fluids and proteins   States protein recommendations and appropriate sources post-operatively  Identifies the need for appropriate texture modifications, mastication, and bite sizes when consuming solids  Identifies appropriate multivitamin and calcium sources post-operatively  Describes the need for physical activity post-operatively and will follow MD recommendations  States when to call healthcare provider regarding medication questions or post-operative complications  Handouts given during class include:  Phase 3A: Soft, High Protein Diet Handout  Follow-Up Plan: Patient will follow-up at [facility] in 6 weeks for 2 month post-op nutrition visit for diet advancement per MD.

## 2017-09-03 ENCOUNTER — Telehealth: Payer: Self-pay | Admitting: Registered"

## 2017-09-03 NOTE — Telephone Encounter (Signed)
RD called pt to verify fluid intake once starting soft, solid proteins 2 week post-bariatric surgery. Pt was unavailable; left voicemail.

## 2017-10-12 ENCOUNTER — Encounter: Payer: BLUE CROSS/BLUE SHIELD | Attending: General Surgery | Admitting: Registered"

## 2017-10-12 ENCOUNTER — Encounter: Payer: Self-pay | Admitting: Registered"

## 2017-10-12 DIAGNOSIS — Z713 Dietary counseling and surveillance: Secondary | ICD-10-CM | POA: Diagnosis not present

## 2017-10-12 DIAGNOSIS — Z6834 Body mass index (BMI) 34.0-34.9, adult: Secondary | ICD-10-CM | POA: Diagnosis not present

## 2017-10-12 DIAGNOSIS — E119 Type 2 diabetes mellitus without complications: Secondary | ICD-10-CM

## 2017-10-12 NOTE — Progress Notes (Signed)
Follow-up visit:  8 Weeks Post-Operative Sleeve Gastrectomy Surgery  Medical Nutrition Therapy:  Appt start time: 10:30 end time:  11:07.  Primary concerns today: Post-operative Bariatric Surgery Nutrition Management.  Non scale victories: lower blood sugar numbers, currently training program for couch to 5K, running more/less walking, feels better, excited to exercise  Surgery date: 08/16/2017 Surgery type: Sleeve Start weight at [facility]: 249.5 Weight today: 232.6 Weight change: 4.6 lbs loss from 237.2 (08/31/2017) Total weight lost: 16.9 lbs Weight loss goal: lose weight and bring control diabetes   TANITA  BODY COMP RESULTS  08/31/2017 10/12/2017   BMI (kg/m^2) 35.0 33.4   Fat Mass (lbs) 93.6 105.2   Fat Free Mass (lbs) 143.6 127.4   Total Body Water (lbs) 107.8 92.2    Pt states he starts BELT on Friday and excited about it. Pt states he wants to run a competitive 5K within the next year. Pt states he was starting to get bored with protein only options and started following some keto recipes. Pt states everything has gone well, no complaints or issues. Pt is doing well!   Preferred Learning Style:   No preference indicated   Learning Readiness:   Ready  Change in progress  24-hr recall: B (AM): 1 egg (6g), cheddar cheese (7g), sausage (7g) Snk (AM): peanut butter ball (12g)  L (PM): 4-5 oz ground [place] (28-35g) or hot dogs  Snk (PM): cheese stick (7g), deli meat (7g)  D (PM): 4-5 oz ground beef (28-35g) with taco seasoning Snk (PM): greek yogurt (12-5g) or sugar-free popsicles  Fluid intake: coffee, water, unsweetened tea; 64+ ounces Estimated total protein intake: 80+ grams  Medications: See list  Supplementation: Bariatric Advantage chewable multi + 3 calcium   CBG monitoring: a couple of times a day Average CBG per patient: 150 Last patient reported A1c: 8.6 (07/2017)  Using straws: no Drinking while eating: no Having you been chewing well:  yes Chewing/swallowing difficulties: no Changes in vision: no Changes to mood/headaches: no Hair loss/Changes to skin/Changes to nails: no, no, no Any difficulty focusing or concentrating: no Sweating: no Dizziness/Lightheaded: no Palpitations: no  Carbonated beverages: no N/V/D/C/GAS: no, no, no, yes-taking Benefiber daily, no Abdominal Pain: no Dumping syndrome: no Last Lap-Band fill: N/A  Recent physical activity: yardwork,   Progress Towards Goal(s):  In progress.  Handouts given during visit include:  Phase IV: High Protein + NS vegetables   Nutritional Diagnosis:  [place]-3.3 Overweight/obesity related to past poor dietary habits and physical inactivity as evidenced by patient w/ recent sleeve gastrectomy surgery following dietary guidelines for continued weight loss.     Intervention:  Nutrition education and counseling. Pt was educated and counseled on the next phase of post-op diet. Pt was in agreement with goals listed.  Goals:  Follow Phase 3B: High Protein + Non-Starchy Vegetables  Eat 3-6 small meals/snacks, every 3-5 hrs  Increase lean protein foods to meet 80g goal  Increase fluid intake to 64oz +  Avoid drinking 15 minutes before, during and 30 minutes after eating  Aim for >30 min of physical activity daily  Look into Skinny Girl dressing for salads  Teaching Method Utilized:  Visual Auditory Hands on  Barriers to learning/adherence to lifestyle change: none identified  Demonstrated degree of understanding via:  Teach Back   Monitoring/Evaluation:  Dietary intake, exercise, lap band fills, and body weight. Follow up in 4 months for 6 month post-op visit.

## 2017-10-12 NOTE — Patient Instructions (Signed)
Goals:  Follow Phase 3B: High Protein + Non-Starchy Vegetables  Eat 3-6 small meals/snacks, every 3-5 hrs  Increase lean protein foods to meet 80g goal  Increase fluid intake to 64oz +  Avoid drinking 15 minutes before, during and 30 minutes after eating  Aim for >30 min of physical activity daily  Look into Skinny Girl dressing for salads

## 2017-10-22 DIAGNOSIS — E1136 Type 2 diabetes mellitus with diabetic cataract: Secondary | ICD-10-CM | POA: Diagnosis not present

## 2017-10-22 DIAGNOSIS — Z9884 Bariatric surgery status: Secondary | ICD-10-CM | POA: Diagnosis not present

## 2017-10-22 DIAGNOSIS — E669 Obesity, unspecified: Secondary | ICD-10-CM | POA: Diagnosis not present

## 2017-10-22 DIAGNOSIS — Z79899 Other long term (current) drug therapy: Secondary | ICD-10-CM | POA: Diagnosis not present

## 2018-01-12 DIAGNOSIS — Z23 Encounter for immunization: Secondary | ICD-10-CM | POA: Diagnosis not present

## 2018-01-12 DIAGNOSIS — E1136 Type 2 diabetes mellitus with diabetic cataract: Secondary | ICD-10-CM | POA: Diagnosis not present

## 2018-01-12 DIAGNOSIS — Z5181 Encounter for therapeutic drug level monitoring: Secondary | ICD-10-CM | POA: Diagnosis not present

## 2018-01-12 DIAGNOSIS — E669 Obesity, unspecified: Secondary | ICD-10-CM | POA: Diagnosis not present

## 2018-01-12 DIAGNOSIS — Z9884 Bariatric surgery status: Secondary | ICD-10-CM | POA: Diagnosis not present

## 2018-02-15 ENCOUNTER — Encounter: Payer: BLUE CROSS/BLUE SHIELD | Attending: General Surgery | Admitting: Skilled Nursing Facility1

## 2018-02-15 DIAGNOSIS — Z6830 Body mass index (BMI) 30.0-30.9, adult: Secondary | ICD-10-CM | POA: Diagnosis not present

## 2018-02-15 DIAGNOSIS — Z713 Dietary counseling and surveillance: Secondary | ICD-10-CM | POA: Insufficient documentation

## 2018-02-15 DIAGNOSIS — E119 Type 2 diabetes mellitus without complications: Secondary | ICD-10-CM

## 2018-02-21 NOTE — Progress Notes (Signed)
Follow-up visit:  Post-Operative Sleeve Surgery  Medical Nutrition Therapy:  Appt start time: 6:00pm end time:  7:00pm  Primary concerns today: Post-operative Bariatric Surgery Nutrition Management 6 Month Post-Op Class  Surgery date: 08/16/2017 Surgery type: Sleeve Start weight at [facility]: 249.5 Weight today: 214.4 Weight loss goal: lose weight and bring control diabetes   TANITA  BODY COMP RESULTS  08/31/2017 10/12/2017 02/16/2018   BMI (kg/m^2) 35.0 33.4 30.8   Fat Mass (lbs) 93.6 105.2 67.6   Fat Free Mass (lbs) 143.6 127.4 146.8   Total Body Water (lbs) 107.8 92.2 103     Information Reviewed/ Discussed During Appointment: -Review of composition scale numbers -Fluid requirements (64-100 ounces) -Protein requirements (60-80g) -Strategies for tolerating diet -Advancement of diet to include Starchy vegetables -Barriers to inclusion of new foods -Inclusion of appropriate multivitamin and calcium supplements  -Exercise recommendations   Fluid intake: adequate  Medications: see list Supplementation: adequate   Using straws: no Drinking while eating: no Having you been chewing well:no Chewing/swallowing difficulties: no Changes in vision: no Changes to mood/headaches: no Hair loss/Cahnges to skin/Changes to nails: no Any difficulty focusing or concentrating: no Sweating: no Dizziness/Lightheaded: no Palpitations: no  Carbonated beverages: no N/V/D/C/GAS: no Abdominal Pain: no Dumping syndrome: no  Recent physical activity:  BELT  Progress Towards Goal(s):  In progress.  Handouts given during visit include:  Phase V diet Progression   Goals Sheet  The Benefits of Exercise are endless.....  Support Group Topics  Pt Chosen Goals:  I will take the stairs whenever available by 02/28/2018  Teaching Method Utilized:  Visual Auditory Hands on   Demonstrated degree of understanding via:  Teach Back   Monitoring/Evaluation:  Dietary intake, exercise, and  body weight. Follow up in 3 months for 9 month post-op visit.

## 2018-04-27 DIAGNOSIS — Z9884 Bariatric surgery status: Secondary | ICD-10-CM | POA: Diagnosis not present

## 2018-04-27 DIAGNOSIS — E669 Obesity, unspecified: Secondary | ICD-10-CM | POA: Diagnosis not present

## 2018-04-27 DIAGNOSIS — E1136 Type 2 diabetes mellitus with diabetic cataract: Secondary | ICD-10-CM | POA: Diagnosis not present

## 2018-04-27 DIAGNOSIS — Z79899 Other long term (current) drug therapy: Secondary | ICD-10-CM | POA: Diagnosis not present

## 2018-06-21 IMAGING — RF DG UGI W/ KUB
13 of 20 series · 14 of 22 positions shown · non-contrast
Comparison: 08/12/2015 CT.

CLINICAL DATA: 56-year-old male pre gastric sleeve procedure. No
complaints. History of colon cancer. Initial encounter.

EXAM:
UPPER GI SERIES WITH KUB
TECHNIQUE: After obtaining a scout radiograph a routine upper GI series was
performed using thin barium per protocol.
FLUOROSCOPY TIME:  Fluoroscopy Time:  1 minutes and 12 seconds
Radiation Exposure Index (if provided by the fluoroscopic device):
37.4 mGy

[Series 1: t abdomen supine · 0.15mm/px · 1 of 1 slices shown]
[im 1/1]
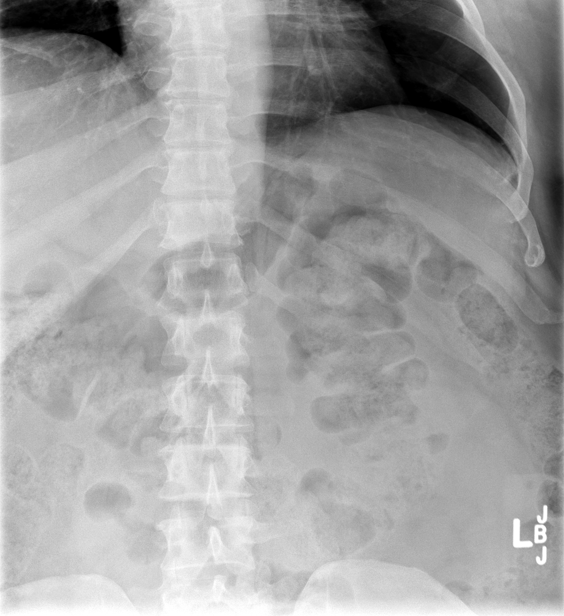

[Series 2: fluoro_barium 2fps_bw · 0.18mm/px · 2 of 9 frames shown]
[frame 5/9]
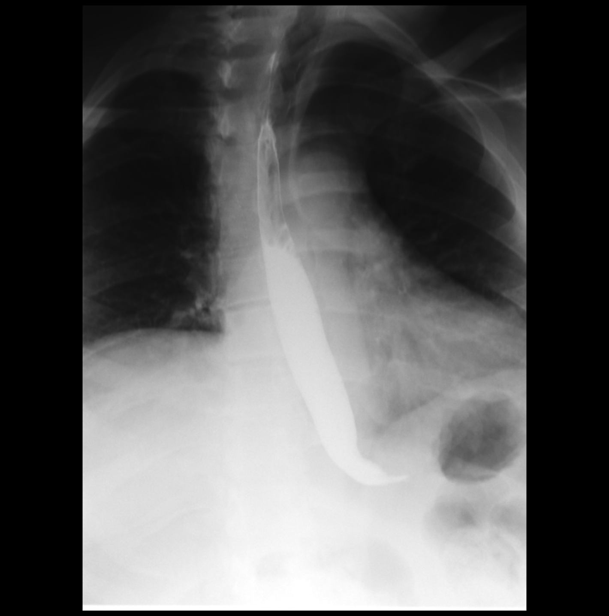
[frame 8/9]
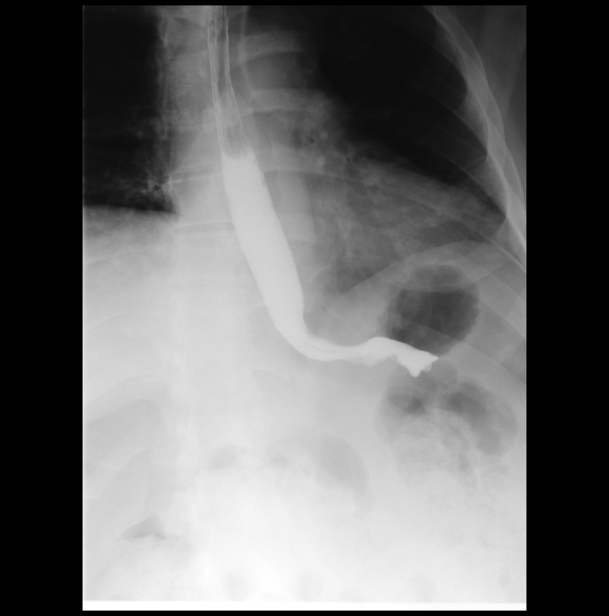

[Series 4: cp_standard · 0.27mm/px · 1 of 1 slices shown (1 of 11)]
[im 1/1]
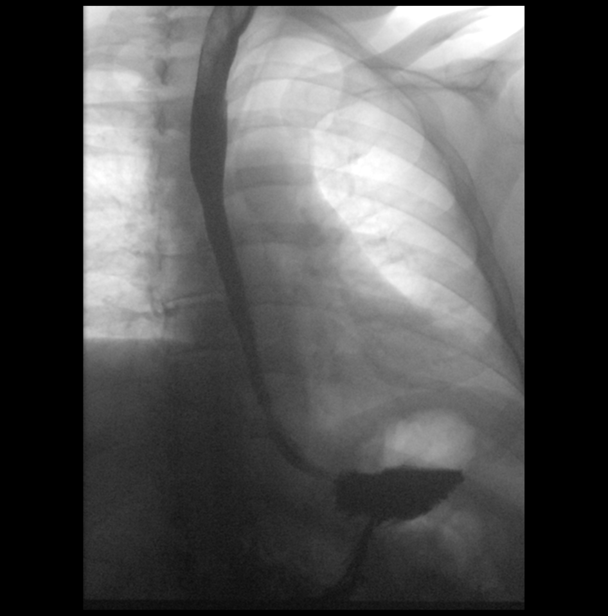

[Series 6: cp_standard · 0.27mm/px · 1 of 1 slices shown (2 of 11)]
[im 1/1]
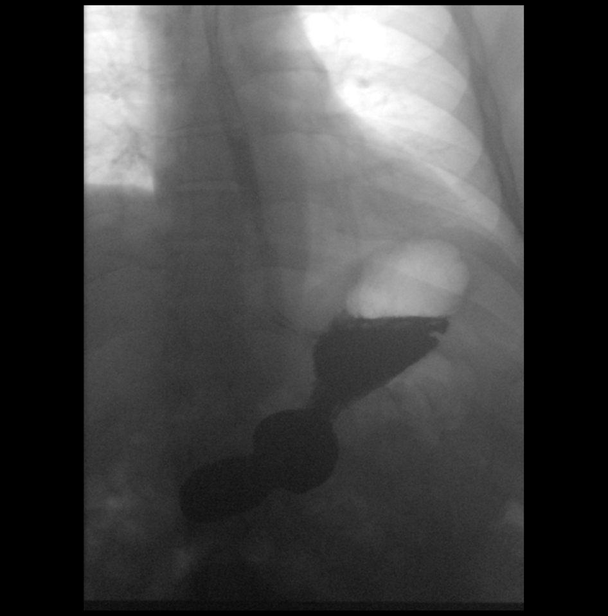

[Series 7: cp_standard · 0.27mm/px · 1 of 1 slices shown (3 of 11)]
[im 1/1]
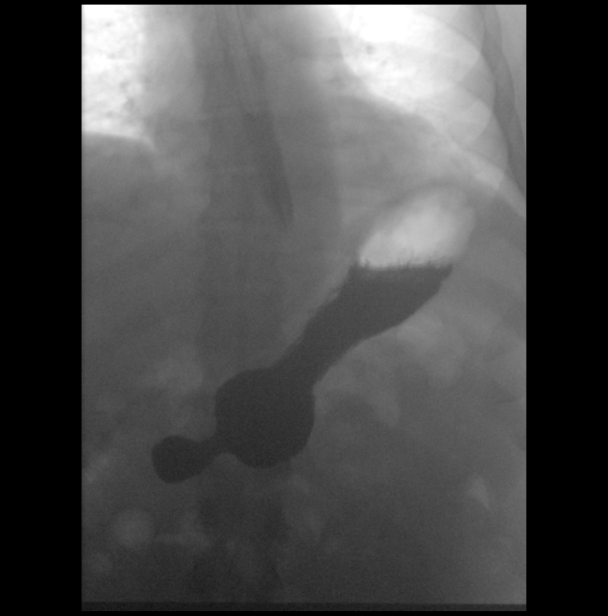

[Series 9: cp_standard · 0.27mm/px · 1 of 1 slices shown (4 of 11)]
[im 1/1]
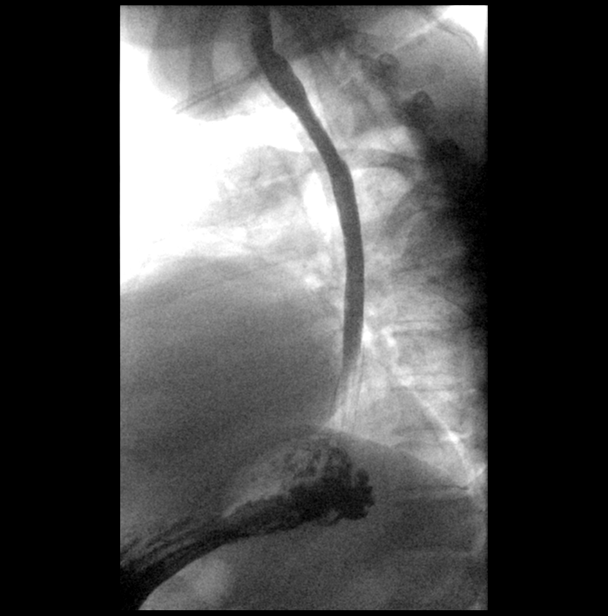

[Series 10: cp_standard · 0.27mm/px · 1 of 1 slices shown (5 of 11)]
[im 1/1]
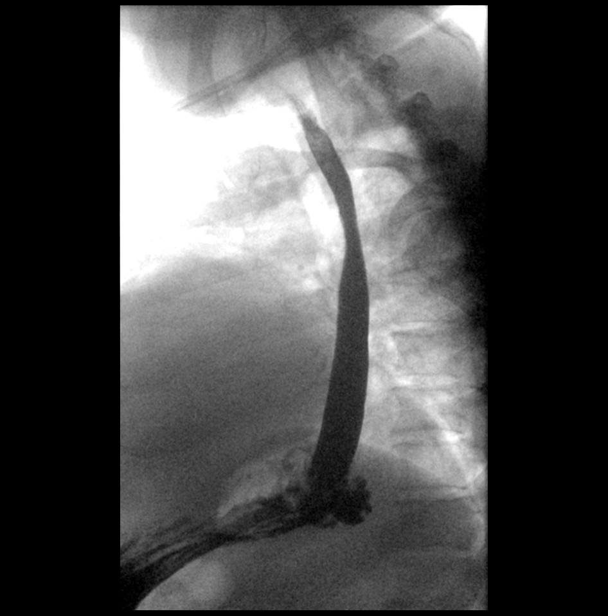

[Series 12: cp_standard · 0.27mm/px · 1 of 1 slices shown (6 of 11)]
[im 1/1]
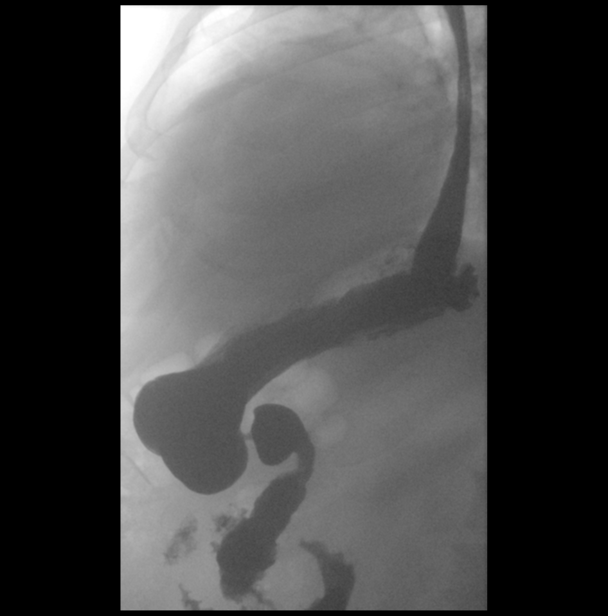

[Series 13: cp_standard · 0.27mm/px · 1 of 1 slices shown (7 of 11)]
[im 1/1]
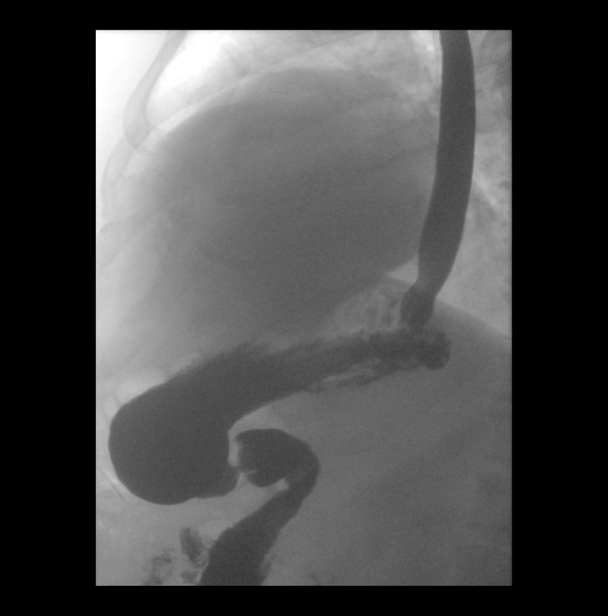

[Series 15: cp_standard · 0.28mm/px · 1 of 1 slices shown (8 of 11)]
[im 1/1]
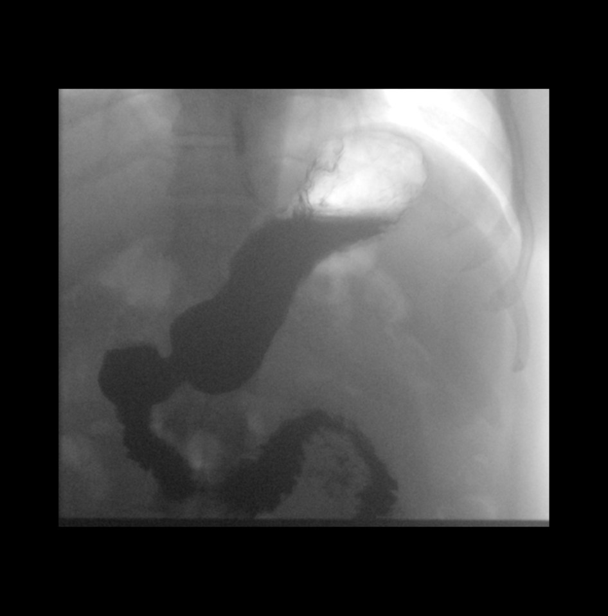

[Series 17: cp_standard · 0.28mm/px · 1 of 1 slices shown (9 of 11)]
[im 1/1]
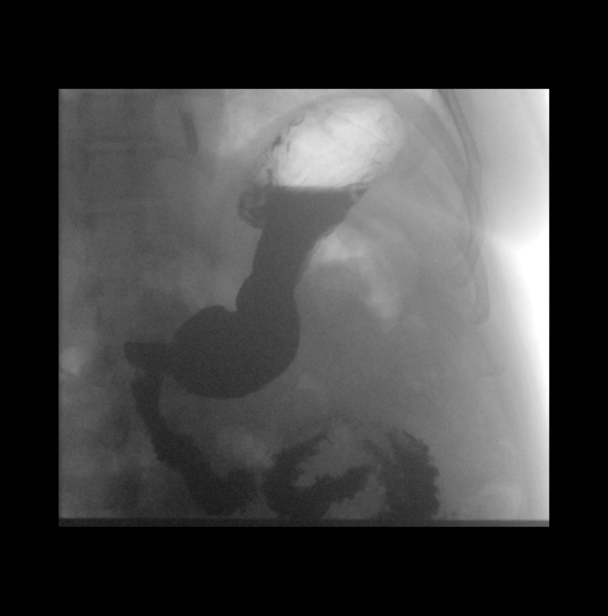

[Series 18: cp_standard · 0.28mm/px · 1 of 1 slices shown (10 of 11)]
[im 1/1]
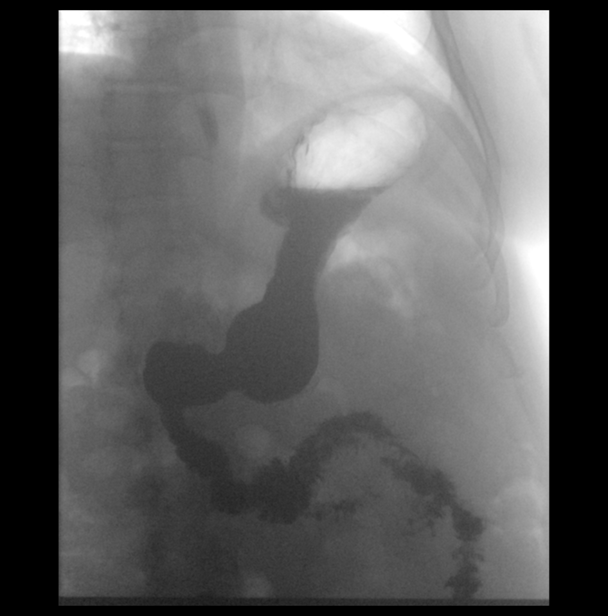

[Series 20: cp_standard · 0.27mm/px · 1 of 1 slices shown (11 of 11)]
[im 1/1]
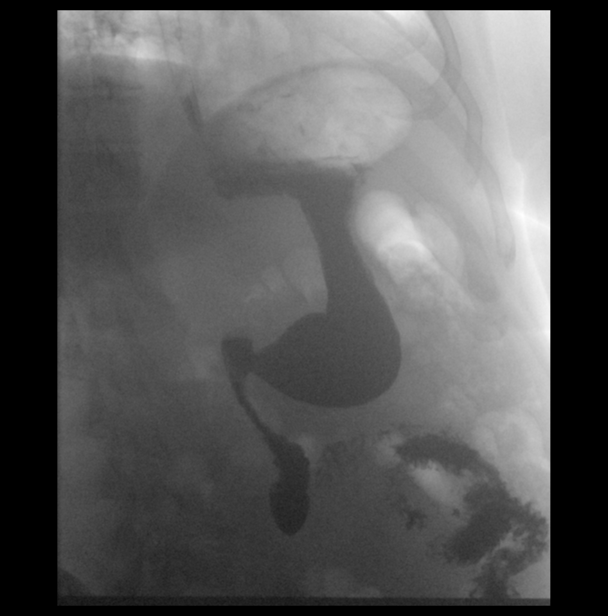

[14 of 22 positions shown; findings below may reference images not displayed]

FINDINGS: Scout view unremarkable.

Normal primary esophageal stripping wave without esophageal
obstructing or constricting lesion. No reflux with change of patient
position.

Gastric contour within normal limits without ulceration or mass
identified on this single contrast exam.

Duodenal bulb within normal limits without ulceration. Proximally
visualized small bowel within normal limits.
IMPRESSION: Negative single contrast upper GI series.

## 2018-10-12 DIAGNOSIS — Z9884 Bariatric surgery status: Secondary | ICD-10-CM | POA: Diagnosis not present

## 2018-10-12 DIAGNOSIS — G4733 Obstructive sleep apnea (adult) (pediatric): Secondary | ICD-10-CM | POA: Diagnosis not present

## 2018-10-12 DIAGNOSIS — Z9989 Dependence on other enabling machines and devices: Secondary | ICD-10-CM | POA: Diagnosis not present

## 2018-10-12 DIAGNOSIS — E1169 Type 2 diabetes mellitus with other specified complication: Secondary | ICD-10-CM | POA: Diagnosis not present

## 2018-10-26 DIAGNOSIS — Z5181 Encounter for therapeutic drug level monitoring: Secondary | ICD-10-CM | POA: Diagnosis not present

## 2018-10-26 DIAGNOSIS — E669 Obesity, unspecified: Secondary | ICD-10-CM | POA: Diagnosis not present

## 2018-10-26 DIAGNOSIS — Z9884 Bariatric surgery status: Secondary | ICD-10-CM | POA: Diagnosis not present

## 2018-10-26 DIAGNOSIS — E1136 Type 2 diabetes mellitus with diabetic cataract: Secondary | ICD-10-CM | POA: Diagnosis not present

## 2019-01-12 DIAGNOSIS — Z20828 Contact with and (suspected) exposure to other viral communicable diseases: Secondary | ICD-10-CM | POA: Diagnosis not present

## 2019-01-13 DIAGNOSIS — Z9989 Dependence on other enabling machines and devices: Secondary | ICD-10-CM | POA: Diagnosis not present

## 2019-01-13 DIAGNOSIS — G4733 Obstructive sleep apnea (adult) (pediatric): Secondary | ICD-10-CM | POA: Diagnosis not present

## 2019-01-13 DIAGNOSIS — Z9884 Bariatric surgery status: Secondary | ICD-10-CM | POA: Diagnosis not present

## 2019-01-13 DIAGNOSIS — E669 Obesity, unspecified: Secondary | ICD-10-CM | POA: Diagnosis not present

## 2019-02-15 DIAGNOSIS — F809 Developmental disorder of speech and language, unspecified: Secondary | ICD-10-CM | POA: Diagnosis not present

## 2019-05-04 DIAGNOSIS — E559 Vitamin D deficiency, unspecified: Secondary | ICD-10-CM | POA: Diagnosis not present

## 2019-05-04 DIAGNOSIS — E1136 Type 2 diabetes mellitus with diabetic cataract: Secondary | ICD-10-CM | POA: Diagnosis not present

## 2019-05-04 DIAGNOSIS — Z79899 Other long term (current) drug therapy: Secondary | ICD-10-CM | POA: Diagnosis not present

## 2019-05-04 DIAGNOSIS — Z9884 Bariatric surgery status: Secondary | ICD-10-CM | POA: Diagnosis not present

## 2019-05-04 DIAGNOSIS — Z789 Other specified health status: Secondary | ICD-10-CM | POA: Diagnosis not present

## 2019-06-15 ENCOUNTER — Ambulatory Visit: Payer: Self-pay | Attending: Family

## 2019-06-15 DIAGNOSIS — Z23 Encounter for immunization: Secondary | ICD-10-CM

## 2019-06-15 NOTE — Progress Notes (Signed)
   Covid-19 Vaccination Clinic  Name:  [name]    MRN: [id number] DOB: Sep 04, 1960  06/15/2019  [name] was observed post Covid-19 immunization for 15 minutes without incident. He was provided with Vaccine Information Sheet and instruction to access the V-Safe system.   [name] was instructed to call 911 with any severe reactions post vaccine: Marland Kitchen Difficulty breathing  . Swelling of face and throat  . A fast heartbeat  . A bad rash all over body  . Dizziness and weakness   Immunizations Administered    Name Date Dose VIS Date Route   Moderna COVID-19 Vaccine 06/15/2019  3:35 PM 0.5 mL 03/07/2019 Intramuscular   Manufacturer: Moderna   Lot: [id number]   [id number]

## 2019-07-18 ENCOUNTER — Ambulatory Visit: Payer: Self-pay | Attending: Family

## 2019-07-18 DIAGNOSIS — Z23 Encounter for immunization: Secondary | ICD-10-CM

## 2019-07-18 NOTE — Progress Notes (Signed)
   Covid-19 Vaccination Clinic  Name:  James Buck    MRN: [id number] DOB: 1960-12-02  07/18/2019  [name] was observed post Covid-19 immunization for 15 minutes without incident. He was provided with Vaccine Information Sheet and instruction to access the V-Safe system.   [name] was instructed to call 911 with any severe reactions post vaccine: Marland Kitchen Difficulty breathing  . Swelling of face and throat  . A fast heartbeat  . A bad rash all over body  . Dizziness and weakness   Immunizations Administered    Name Date Dose VIS Date Route   Moderna COVID-19 Vaccine 07/18/2019  2:45 AM 0.5 mL 03/07/2019 Intramuscular   Manufacturer: Moderna   Lot: [id number]   [id number]

## 2019-07-20 DIAGNOSIS — F840 Autistic disorder: Secondary | ICD-10-CM | POA: Diagnosis not present

## 2019-08-10 DIAGNOSIS — F840 Autistic disorder: Secondary | ICD-10-CM | POA: Diagnosis not present

## 2019-11-01 DIAGNOSIS — Z9884 Bariatric surgery status: Secondary | ICD-10-CM | POA: Diagnosis not present

## 2019-11-01 DIAGNOSIS — E1136 Type 2 diabetes mellitus with diabetic cataract: Secondary | ICD-10-CM | POA: Diagnosis not present

## 2019-11-01 DIAGNOSIS — Z789 Other specified health status: Secondary | ICD-10-CM | POA: Diagnosis not present

## 2019-11-01 DIAGNOSIS — E663 Overweight: Secondary | ICD-10-CM | POA: Diagnosis not present

## 2020-03-05 ENCOUNTER — Encounter (HOSPITAL_COMMUNITY): Payer: Self-pay

## 2020-04-04 DIAGNOSIS — G4733 Obstructive sleep apnea (adult) (pediatric): Secondary | ICD-10-CM | POA: Diagnosis not present

## 2020-04-25 DIAGNOSIS — F902 Attention-deficit hyperactivity disorder, combined type: Secondary | ICD-10-CM | POA: Diagnosis not present

## 2020-05-03 DIAGNOSIS — E538 Deficiency of other specified B group vitamins: Secondary | ICD-10-CM | POA: Diagnosis not present

## 2020-05-03 DIAGNOSIS — Z9884 Bariatric surgery status: Secondary | ICD-10-CM | POA: Diagnosis not present

## 2020-05-03 DIAGNOSIS — E559 Vitamin D deficiency, unspecified: Secondary | ICD-10-CM | POA: Diagnosis not present

## 2020-05-03 DIAGNOSIS — E1136 Type 2 diabetes mellitus with diabetic cataract: Secondary | ICD-10-CM | POA: Diagnosis not present

## 2020-05-03 DIAGNOSIS — E663 Overweight: Secondary | ICD-10-CM | POA: Diagnosis not present

## 2020-05-03 DIAGNOSIS — Z789 Other specified health status: Secondary | ICD-10-CM | POA: Diagnosis not present

## 2020-05-06 DIAGNOSIS — F902 Attention-deficit hyperactivity disorder, combined type: Secondary | ICD-10-CM | POA: Diagnosis not present

## 2020-05-22 DIAGNOSIS — F902 Attention-deficit hyperactivity disorder, combined type: Secondary | ICD-10-CM | POA: Diagnosis not present

## 2020-06-04 DIAGNOSIS — F902 Attention-deficit hyperactivity disorder, combined type: Secondary | ICD-10-CM | POA: Diagnosis not present

## 2020-06-18 ENCOUNTER — Encounter: Payer: Self-pay | Admitting: Internal Medicine

## 2020-06-21 DIAGNOSIS — F902 Attention-deficit hyperactivity disorder, combined type: Secondary | ICD-10-CM | POA: Diagnosis not present

## 2020-07-03 DIAGNOSIS — G4733 Obstructive sleep apnea (adult) (pediatric): Secondary | ICD-10-CM | POA: Diagnosis not present

## 2020-07-05 DIAGNOSIS — F902 Attention-deficit hyperactivity disorder, combined type: Secondary | ICD-10-CM | POA: Diagnosis not present

## 2020-07-10 DIAGNOSIS — Z9884 Bariatric surgery status: Secondary | ICD-10-CM | POA: Diagnosis not present

## 2020-07-10 DIAGNOSIS — G4733 Obstructive sleep apnea (adult) (pediatric): Secondary | ICD-10-CM | POA: Diagnosis not present

## 2020-07-10 DIAGNOSIS — Z9989 Dependence on other enabling machines and devices: Secondary | ICD-10-CM | POA: Diagnosis not present

## 2020-07-10 DIAGNOSIS — E669 Obesity, unspecified: Secondary | ICD-10-CM | POA: Diagnosis not present

## 2020-07-18 DIAGNOSIS — F902 Attention-deficit hyperactivity disorder, combined type: Secondary | ICD-10-CM | POA: Diagnosis not present

## 2020-07-31 DIAGNOSIS — F902 Attention-deficit hyperactivity disorder, combined type: Secondary | ICD-10-CM | POA: Diagnosis not present

## 2020-08-15 DIAGNOSIS — F902 Attention-deficit hyperactivity disorder, combined type: Secondary | ICD-10-CM | POA: Diagnosis not present

## 2020-08-25 ENCOUNTER — Encounter: Payer: Self-pay | Admitting: Neurology

## 2020-08-26 ENCOUNTER — Encounter: Payer: Self-pay | Admitting: Neurology

## 2020-08-26 ENCOUNTER — Ambulatory Visit: Payer: BC Managed Care – PPO | Admitting: Neurology

## 2020-08-26 VITALS — BP 137/72 | HR 69 | Ht 69.0 in | Wt 202.0 lb

## 2020-08-26 DIAGNOSIS — R634 Abnormal weight loss: Secondary | ICD-10-CM | POA: Diagnosis not present

## 2020-08-26 DIAGNOSIS — Z9884 Bariatric surgery status: Secondary | ICD-10-CM

## 2020-08-26 DIAGNOSIS — G4733 Obstructive sleep apnea (adult) (pediatric): Secondary | ICD-10-CM | POA: Diagnosis not present

## 2020-08-26 DIAGNOSIS — Z9989 Dependence on other enabling machines and devices: Secondary | ICD-10-CM | POA: Diagnosis not present

## 2020-08-26 NOTE — Progress Notes (Signed)
Subjective:    Patient ID: James Buck is a 60 y.o. male.  HPI     Star Age, MD, PhD [facility] [address] [address] [place], [place] [address]  Dear Dr. Redmond Pulling,    I saw your patient, James Buck, upon your kind request in my sleep clinic today for initial consultation of his sleep disorder, in particular, evaluation of his prior diagnosis of obstructive sleep apnea.  The patient is unaccompanied today.  As you know, James Buck is a 60 year old right-handed gentleman with an underlying medical history of obesity with status post sleeve gastrectomy in May 2019, diabetes, neuropathy (for which he had seen Dr. Krista Blue in our office in 2015), osteoarthritis, allergies, sinus tachycardia, vitamin D deficiency, vitamin B12 deficiency, colon cancer, and allergies, who was previously diagnosed with obstructive sleep apnea and placed on a positive airway pressure treatment.  I reviewed your office note from 07/10/2020.  His Epworth sleepiness score is 8 out of 24, fatigue severity score is 13 out of 63.  He has been on CPAP therapy essentially since 2008 or 2009.  This is his second machine, it is a S9 escape and he had this machine for about 6 or 7 years.  He used to go to [place], [place] for his sleep specialist and would like to get established locally here.  He has a bedtime between 10 and 11 PM and rise time around 6 AM.  He denies any night to night nocturia or recurrent morning headaches. Prior sleep study results are not available for my review today.  A CPAP compliance download was reviewed, from 07/27/2020 through 08/25/2020 he used his machine every night with percent use days greater than 4 hours at 100%, average usage of 6 hours and 39 minutes, residual AHI information not available and leak information not available from this machine/download.  Pressure at 14 cm with EPR of 2.  He reports full compliance, he reports that he has used the Mirage FX nasal  mask with good success, nasal pillows are difficult to tolerate because of the pressure.  He has lost about 60 pounds since his weight loss surgery, prior to that, he had weight loss because of his colon cancer diagnosis.  He is a retired Airline pilot.  He lives with his son who is 75 years old, he has an older daughter age 37.  He is divorced.  He drinks alcohol occasionally, drinks caffeine in the form of coffee, 2 cups in the mornings and quit smoking in 2006.  He is very motivated to continue with CPAP therapy and has benefited from it significantly.  His Past Medical History Is Significant For: Past Medical History:  Diagnosis Date  . Allergy    seasonal  . Cancer ([place]) colon  . Complication of anesthesia    woke up one time still intubated- port for chemotherapy  . Diabetes mellitus without complication ([place])   . Diabetic neuropathy ([place])   . H/O colonoscopy   . OSA on CPAP   . Osteoarthritis    knees  . Peripheral neuropathy   . Peripheral neuropathy   . Seasonal allergies   . Sinus tachycardia   . Sleep apnea    wears cpap   . Vitamin B12 deficiency   . Vitamin D deficiency     His Past Surgical History Is Significant For: Past Surgical History:  Procedure Laterality Date  . angiograms  2008   2 venograms  for SVC blockages  .  colon cancer resection  2007    chemotherapy, 2/12 malignant abdominal LN (more chemotherapy) 4/12-9/12.   Marland Kitchen COLON RESECTION  2012   FOLFOX - all complete since 2012  . COLONOSCOPY    . COLOSTOMY REVISION    . COLOSTOMY TAKEDOWN  2008   colostomy in 2007  . EYE SURGERY     radial carototomy-right eye  . EYE SURGERY     bilateral cataract surgery with lens implants  . KNEE SURGERY Right 2007  . LAPAROSCOPIC GASTRIC SLEEVE RESECTION N/A 08/16/2017   Procedure: LAPAROSCOPIC GASTRIC SLEEVE RESECTION WITH HIATAL HERNIA REPAIR, UPPER ENDOSCOPY;  Surgeon: Greer Pickerel, MD;  Location: [facility];  Service: General;  Laterality: N/A;  . porto cath      placement 2012 removed 12/2010  . VASECTOMY      His Family History Is Significant For: Family History  Problem Relation Age of Onset  . Lymphoma Father   . Cancer Other   . COPD Other   . Hypertension Other   . Colon cancer Neg Hx   . Colon polyps Neg Hx   . Esophageal cancer Neg Hx   . Rectal cancer Neg Hx   . Stomach cancer Neg Hx     His Social History Is Significant For: Social History   Socioeconomic History  . Marital status: Divorced    Spouse name: Not on file  . Number of children: 2  . Years of education: 8  . Highest education level: Not on file  Occupational History    Comment: retired  Tobacco Use  . Smoking status: Former Smoker    Quit date: 04/07/1987    Years since quitting: 33.4  . Smokeless tobacco: Never Used  Vaping Use  . Vaping Use: Never used  Substance and Sexual Activity  . Alcohol use: Yes    Alcohol/week: 1.0 standard drink    Types: 1 Cans of beer per week    Comment: SOCIAL  . Drug use: No  . Sexual activity: Not on file  Other Topics Concern  . Not on file  Social History Narrative   Patient is retired.   High school education.    Patient has two children.   Caffeine consumption is 2 daily   Patient is right handed   Social Determinants of Health   Financial Resource Strain: Not on file  Food Insecurity: Not on file  Transportation Needs: Not on file  Physical Activity: Not on file  Stress: Not on file  Social Connections: Not on file    His Allergies Are:  Allergies  Allergen Reactions  . Statins Other (See Comments)    Body aches  . Victoza [Liraglutide] Nausea Only    Prior nausea when taken in AM (tolerates bedtime dosing with no difficulty)  :   His Current Medications Are:  Outpatient Encounter Medications as of 08/26/2020  Medication Sig  . fluticasone (FLONASE) 50 MCG/ACT nasal spray Place 2 sprays into the nose daily. Reported on 04/24/2015  . glucose blood test strip 1 each. Use as instructed. Check cbg q  am, alternatinf between AM and PM mels. AC   QID  . metFORMIN (GLUCOPHAGE) 500 MG tablet 1000 mcg x 2  . Multiple Vitamins-Minerals (BARIATRIC MULTIVITAMINS/IRON) CAPS See admin instructions.  Marland Kitchen oxymetazoline (AFRIN) 0.05 % nasal spray Place 1 spray into both nostrils 2 (two) times daily as needed for congestion.  . pantoprazole (PROTONIX) 40 MG tablet Take 1 tablet (40 mg total) by mouth daily.  . pseudoephedrine (  SUDAFED) 30 MG tablet Take 30 mg by mouth every 4 (four) hours as needed for congestion.  . [DISCONTINUED] Cholecalciferol (VITAMIN D3) 5000 UNITS CAPS Take by mouth. Reported on 04/24/2015  . [DISCONTINUED] insulin lispro (HUMALOG KWIKPEN) 100 UNIT/ML KiwkPen Inject 0-0.2 mLs (0-20 Units total) into the skin 3 (three) times daily. See sliding scale instruction  . [DISCONTINUED] Insulin Pen Needle 31G X 5 MM MISC 1 pen by Does not apply route 3 (three) times daily.  . [DISCONTINUED] Multiple Vitamin (MULTIVITAMIN WITH MINERALS) TABS tablet Take 1 tablet by mouth daily. (Patient not taking: Reported on 08/26/2020)  . [DISCONTINUED] ondansetron (ZOFRAN) 4 MG tablet Take 1 tablet (4 mg total) by mouth every 8 (eight) hours as needed for nausea or vomiting.  . [DISCONTINUED] oxyCODONE (ROXICODONE) 5 MG/5ML solution Take 5 mLs (5 mg total) by mouth every 6 (six) hours as needed for severe pain.   No facility-administered encounter medications on file as of 08/26/2020.  :   Review of Systems:  Out of a complete 14 point review of systems, all are reviewed and negative with the exception of these symptoms as listed below: Review of Systems  Neurological:       Here for sleep consult. Prior sleep study back in 2009 started on CPAP. Reports he is on his second CPAP machine now. Reports he think he is due for a new machine. Epworth Sleepiness Scale 0= would never doze 1= slight chance of dozing 2= moderate chance of dozing 3= high chance of dozing  Sitting and reading:1 Watching  TV:1 Sitting inactive in a public place (ex. Theater or meeting):1 As a passenger in a car for an hour without a break:1 Lying down to rest in the afternoon:3 Sitting and talking to someone:0 Sitting quietly after lunch (no alcohol):1 In a car, while stopped in traffic:0 Total:8     Objective:  Neurological Exam  Physical Exam Physical Examination:   Vitals:   08/26/20 1324  BP: 137/72  Pulse: 69    General Examination: The patient is a very pleasant 61 y.o. male in no acute distress. He appears well-developed and well-nourished and well groomed.   HEENT: Normocephalic, atraumatic, pupils are equal, round and reactive to light, extraocular tracking is good without limitation to gaze excursion or nystagmus noted. Hearing is grossly intact. Face is symmetric with normal facial animation. Speech is clear with no dysarthria noted. There is no hypophonia. There is no lip, neck/head, jaw or voice tremor. Neck is supple with full range of passive and active motion. There are no carotid bruits on auscultation. Oropharynx exam reveals: mild mouth dryness, adequate dental hygiene and mild airway crowding, due to small airway entry, tonsillar size of about 1+, Mallampati class II, somewhat prominent uvula.  Tongue protrudes centrally and palate elevates symmetrically.  Nasal inspection reveals a mildly deviated septum to the right, minimal overbite noted.  Chest: Clear to auscultation without wheezing, rhonchi or crackles noted.  Heart: S1+S2+0, regular and normal without murmurs, rubs or gallops noted.   Abdomen: Soft, non-tender and non-distended with normal bowel sounds appreciated on auscultation.  Extremities: There is no pitting edema in the distal lower extremities bilaterally.   Skin: Warm and dry without trophic changes noted.   Musculoskeletal: exam reveals no obvious joint deformities, tenderness or joint swelling or erythema.   Neurologically:  Mental status: The patient is  awake, alert and oriented in all 4 spheres. His immediate and remote memory, attention, language skills and fund of knowledge are appropriate.  There is no evidence of aphasia, agnosia, apraxia or anomia. Speech is clear with normal prosody and enunciation. Thought process is linear. Mood is normal and affect is normal.  Cranial nerves II - XII are as described above under HEENT exam.  Motor exam: Normal bulk, strength and tone is noted. There is no tremor. Fine motor skills and coordination: grossly intact.  Cerebellar testing: No dysmetria or intention tremor. There is no truncal or gait ataxia.  Sensory exam: intact to light touch in the upper and lower extremities.  Gait, station and balance: He stands easily. No veering to one side is noted. No leaning to one side is noted. Posture is age-appropriate and stance is narrow based. Gait shows normal stride length and normal pace. No problems turning are noted.   Assessment and Plan:  In summary, James Buck is a very pleasant 60 y.o.-year old male with an underlying medical history of obesity with status post sleeve gastrectomy in May 2019, diabetes, neuropathy (for which he had seen Dr. Krista Blue in our office in 2015), osteoarthritis, allergies, sinus tachycardia, vitamin D deficiency, vitamin B12 deficiency, colon cancer, and allergies, who presents for evaluation of his obstructive sleep apnea of several years duration.  He has not had a reevaluation after his weight loss surgery, he has lost a significant amount of weight after his gastric sleeve.  He also lost weight in the context of his colon cancer.  He has been compliant with his CPAP of 14 cm, residual AHI data and leak data is not available on this download and from this older machine.  He is willing to get reevaluated with a home sleep test. I had a long chat with the patient about my findings and the diagnosis of OSA, its prognosis and treatment options. We talked about medical treatments,  surgical interventions and non-pharmacological approaches. I explained in particular the risks and ramifications of untreated moderate to severe OSA, especially with respect to developing cardiovascular disease down the Road, including congestive heart failure, difficult to treat hypertension, cardiac arrhythmias, or stroke. Even type 2 diabetes has, in part, been linked to untreated OSA. Symptoms of untreated OSA include daytime sleepiness, memory problems, mood irritability and mood disorder such as depression and anxiety, lack of energy, as well as recurrent headaches, especially morning headaches. We talked about trying to maintain a healthy lifestyle in general, as well as the importance of weight control. We also talked about the importance of good sleep hygiene. I recommended the following at this time: sleep study, we will proceed with a home sleep test for reevaluation purposes.  He should qualify for new equipment, likely, I will be able to prescribe a new AutoPap machine after his home sleep test.  I explained the sleep test procedure to the patient and also outlined possible surgical and non-surgical treatment options of OSA.  Since he is comfortable using positive airway pressure device, we will maintain treatment with a AutoPap or CPAP machine.  We will plan to follow-up after testing and after he is able to start treatment with a new equipment. I explained the importance of being compliant with PAP treatment, not only for insurance purposes but primarily to improve His symptoms, and for the patient's long term health benefit, including to reduce His cardiovascular risks. I answered all his questions today and the patient was in agreement.  Thank you very much for allowing me to participate in the care of this nice patient. If I can be of any further assistance  to you please do not hesitate to call me at [phone].  Sincerely,   Star Age, MD, PhD

## 2020-08-26 NOTE — Patient Instructions (Addendum)
  Thank you for choosing [facility] for your sleep related care! It was nice to meet you today! I appreciate that you entrust me with your sleep related healthcare concerns. I hope, I was able to address at least some of your concerns today, and that I can help you feel reassured and also get better.    Here is what we discussed today and what we came up with as our plan for you:    Based on your symptoms and your exam I believe you may still be at some risk for obstructive sleep apnea and would benefit from reevaluation as it has been many years and you need new supplies and an updated machine. Plus, you have lost a significant amount of weight since you were originally diagnosed with sleep apnea.  Therefore, I think we should proceed with a sleep study to determine how severe your sleep apnea is. If you have more than mild OSA, I want you to consider ongoing treatment with CPAP. Please remember, the risks and ramifications of moderate to severe obstructive sleep apnea or OSA are: Cardiovascular disease, including congestive heart failure, stroke, difficult to control hypertension, arrhythmias, and even type 2 diabetes has been linked to untreated OSA. Sleep apnea causes disruption of sleep and sleep deprivation in most cases, which, in turn, can cause recurrent headaches, problems with memory, mood, concentration, focus, and vigilance. Most people with untreated sleep apnea report excessive daytime sleepiness, which can affect their ability to drive. Please do not drive if you feel sleepy.  We will plan to follow-up after testing and we will call you with the results in the interim as well. Our sleep lab administrative assistant will call you to schedule your sleep study. If you don't hear back from her by about 2 weeks from now, please feel free to call her at [phone]. You can leave a message with your phone number and concerns, if you get the voicemail box. She will call back as  soon as possible.

## 2020-08-29 DIAGNOSIS — F902 Attention-deficit hyperactivity disorder, combined type: Secondary | ICD-10-CM | POA: Diagnosis not present

## 2020-09-12 DIAGNOSIS — F902 Attention-deficit hyperactivity disorder, combined type: Secondary | ICD-10-CM | POA: Diagnosis not present

## 2020-09-18 ENCOUNTER — Ambulatory Visit (INDEPENDENT_AMBULATORY_CARE_PROVIDER_SITE_OTHER): Payer: BC Managed Care – PPO | Admitting: Neurology

## 2020-09-18 DIAGNOSIS — G4733 Obstructive sleep apnea (adult) (pediatric): Secondary | ICD-10-CM | POA: Diagnosis not present

## 2020-09-18 DIAGNOSIS — Z9989 Dependence on other enabling machines and devices: Secondary | ICD-10-CM

## 2020-09-18 DIAGNOSIS — Z9884 Bariatric surgery status: Secondary | ICD-10-CM

## 2020-09-18 DIAGNOSIS — R634 Abnormal weight loss: Secondary | ICD-10-CM

## 2020-09-19 NOTE — Progress Notes (Signed)
See procedure note.

## 2020-09-25 ENCOUNTER — Encounter: Payer: Self-pay | Admitting: Neurology

## 2020-09-26 DIAGNOSIS — F902 Attention-deficit hyperactivity disorder, combined type: Secondary | ICD-10-CM | POA: Diagnosis not present

## 2020-09-26 NOTE — Progress Notes (Signed)
Patient was referred by Dr. Redmond Pulling.  I saw him on 08/26/2020 for reevaluation of his OSA.  He has been compliant with his old CPAP machine, pressure at 14 cm.  He had a home sleep test on 09/18/2020 which indicated overall mild obstructive sleep apnea but he did have some desaturations into the 80s and higher 70s, I would recommend he continue with treating his sleep apnea with a CPAP or AutoPap machine.  I believe he should be eligible for new equipment and I would like to write for new AutoPap machine.  We can use his current DME company if he has continued with the same DME over the years or we can choose a different one if he would prefer.  He will need a follow-up after he starts treatment with the new equipment within 1 to 3 months.  Please notify him of the compliance criteria as well as the need for follow-up within the window.

## 2020-09-26 NOTE — Procedures (Signed)
[facility] TEST (Watch PAT) REPORT  STUDY DATE: 09-18-20  DOB: 02-13-1961  MRN: [id number]  ORDERING CLINICIAN: Star Age, MD, PhD   REFERRING CLINICIAN: Dr. Redmond Pulling   CLINICAL INFORMATION/HISTORY: 60 year old man with a history of obesity with status post sleeve gastrectomy in May 2019, diabetes, neuropathy, osteoarthritis, allergies, sinus tachycardia, vitamin D deficiency, vitamin B12 deficiency, colon cancer, and allergies, who was previously diagnosed with obstructive sleep apnea and placed on positive airway pressure treatment. He presents for re-evaluation due to significant interim weight loss.  Epworth sleepiness score: 8/24.  BMI: 30.0 kg/m  FINDINGS:   Sleep Summary:   Total Recording Time (hours, min): 7 h 6 min  Total Sleep Time (hours, min):  6 h 1 min   Percent REM (%):    12.46 %   Respiratory Indices:   Calculated pAHI (per hour):  9.0/hour        REM pAHI:    14.2/hour      NREM pAHI: 8.2/hour  Oxygen Saturation Statistics:    Oxygen Saturation (%) Mean:  94%  Minimum oxygen saturation (%):     77%  O2 Saturation Range (%): 77-94%   O2 Saturation (minutes) <=88: 0.3  min  Pulse Rate Statistics:   Pulse Mean (bpm):   57/min    Pulse Range (43-85/min)   IMPRESSION: OSA (obstructive sleep apnea)  RECOMMENDATION:  This home sleep test demonstrates overall mild obstructive sleep apnea with a total AHI of 9/hour, but a few desaturations into the 80s and higher 70s.  O2 nadir was 77%.  Mild to moderate snoring was noted.  The patient has benefited from CPAP therapy.  Ongoing treatment with positive airway pressure is recommended. This can be achieved in the form of autoPAP trial/titration at home. A full night CPAP titration study will help with proper treatment settings and mask fitting, if needed, down the road. Alternative treatments may include ongoing weight loss along with avoidance of the supine sleep position, or an  oral appliance in appropriate candidates.    Please note that untreated obstructive sleep apnea may carry additional perioperative morbidity. Patients with significant obstructive sleep apnea should receive perioperative PAP therapy and the surgeons and particularly the anesthesiologist should be informed of the diagnosis and the severity of the sleep disordered breathing. The patient should be cautioned not to drive, work at heights, or operate dangerous or heavy equipment when tired or sleepy. Review and reiteration of good sleep hygiene measures should be pursued with any patient. Other causes of the patient's symptoms, including circadian rhythm disturbances, an underlying mood disorder, medication effect and/or an underlying medical problem cannot be ruled out based on this test. Clinical correlation is recommended.   The patient and his referring provider will be notified of the test results. The patient will be seen in follow up in sleep clinic at [facility]  I certify that I have reviewed the raw data recording prior to the issuance of this report in accordance with the standards of the American Academy of Sleep Medicine (AASM).  INTERPRETING PHYSICIAN:   Star Age, MD, PhD  Board Certified in Neurology and Sleep Medicine  [facility] [address] [place], [place] [address] [phone]  Sleep Summary   Start Study Time: End Study Time: Total Recording Time:        10:53:34 PM 6:00:12 AM 7 h, 6 min  Total Sleep Time % REM of Sleep Time:  6 h, 1 min  12.5     Respiratory Indices     Total Events REM NREM All Night  pRDI: pAHI 3%: ODI 4%: pAHIc 3%: % CSR: pAHI 4%:  73  53  36  3 0.0 36 22.7 14.2 11.4 1.4 10.9 8.2 5.4 0.4 12.3 9.0 6.1 0.5 0.0 6.1     Oxygen Saturation Statistics   Mean: 94 Minimum: 77 Maximum: 99  Mean of Desaturations Nadirs (%):   90  Oxygen Desatur. %:  4-9 10-20 >20 Total  Events Number Total   33   2 91.7 5.6  1 2.8  36 100.0  Oxygen Saturation: <90 <=88 <85 <80 <70  Duration (minutes): Sleep % 0.9 0.3 0.3 0.0 0.1 0.0 0.0 0.0 0.0 0.0     Pulse Rate Statistics during Sleep (BPM)     Mean: 57 Minimum: 43 Maximum: 85       Body Position Statistics  Position Supine Prone Right Left Non-Supine  Sleep (min) 71.6 0.0 199.5 90.0 289.5  Sleep % 19.8 0.0 55.2 24.9 80.2  pRDI 35.1 N/A 5.2 10.2 6.7  pAHI 3% 30.9 N/A 3.4 4.1 3.6  ODI 4% 24.0 N/A 1.2 2.7 1.7     Snoring Statistics Snoring Level (dB) >40 >50 >60 >70 >80 >Threshold (45)  Sleep (min) 135.5 3.6 1.2 0.0 0.0 6.8  Sleep % 37.5 1.0 0.3 0.0 0.0 1.9   Mean: 41 dB

## 2020-09-26 NOTE — Addendum Note (Signed)
Addended by: Star Age on: 09/26/2020 07:07 AM   Modules accepted: Orders

## 2020-09-30 ENCOUNTER — Telehealth: Payer: Self-pay | Admitting: Neurology

## 2020-09-30 DIAGNOSIS — G4733 Obstructive sleep apnea (adult) (pediatric): Secondary | ICD-10-CM

## 2020-09-30 NOTE — Telephone Encounter (Signed)
Please see my chart message for reference.  I had already placed an order for AutoPap therapy on 09/26/20. I did place a duplicate order today which you can ignore or ignore the previous one.

## 2020-09-30 NOTE — Telephone Encounter (Signed)
See  my chart message from 09/30/20.

## 2020-10-01 DIAGNOSIS — Z9884 Bariatric surgery status: Secondary | ICD-10-CM | POA: Diagnosis not present

## 2020-10-01 DIAGNOSIS — Z789 Other specified health status: Secondary | ICD-10-CM | POA: Diagnosis not present

## 2020-10-01 DIAGNOSIS — E1136 Type 2 diabetes mellitus with diabetic cataract: Secondary | ICD-10-CM | POA: Diagnosis not present

## 2020-10-01 DIAGNOSIS — E663 Overweight: Secondary | ICD-10-CM | POA: Diagnosis not present

## 2020-10-03 DIAGNOSIS — G4733 Obstructive sleep apnea (adult) (pediatric): Secondary | ICD-10-CM | POA: Diagnosis not present

## 2020-10-11 DIAGNOSIS — F902 Attention-deficit hyperactivity disorder, combined type: Secondary | ICD-10-CM | POA: Diagnosis not present

## 2020-10-16 DIAGNOSIS — Z125 Encounter for screening for malignant neoplasm of prostate: Secondary | ICD-10-CM | POA: Diagnosis not present

## 2020-10-16 DIAGNOSIS — L602 Onychogryphosis: Secondary | ICD-10-CM | POA: Diagnosis not present

## 2020-10-16 DIAGNOSIS — F909 Attention-deficit hyperactivity disorder, unspecified type: Secondary | ICD-10-CM | POA: Diagnosis not present

## 2020-10-24 ENCOUNTER — Ambulatory Visit: Payer: BC Managed Care – PPO | Admitting: Podiatry

## 2020-10-24 ENCOUNTER — Encounter: Payer: Self-pay | Admitting: Podiatry

## 2020-10-24 ENCOUNTER — Other Ambulatory Visit: Payer: Self-pay

## 2020-10-24 DIAGNOSIS — B351 Tinea unguium: Secondary | ICD-10-CM

## 2020-10-24 MED ORDER — FLUCONAZOLE 150 MG PO TABS: 450.0000 mg | ORAL_TABLET | ORAL | 0 refills | Status: AC

## 2020-10-24 NOTE — Patient Instructions (Addendum)

## 2020-10-29 ENCOUNTER — Encounter: Payer: Self-pay | Admitting: Podiatry

## 2020-10-29 NOTE — Progress Notes (Signed)
  Subjective:  Patient ID: James Buck, male    DOB: 1960/11/26,  MRN: [id number]  Chief Complaint  Patient presents with   Nail Problem     np-left great toenail abnormal growth    60 y.o. male presents with the above complaint. History confirmed with patient.  Has been like this for some time, he had trauma to the nail in 2018  Objective:  Physical Exam: warm, good capillary refill, no trophic changes or ulcerative lesions, normal DP and PT pulses, and normal sensory exam. Left Foot: Dystrophic yellow mycotic hallux nail subungual debris Assessment:   1. Onychomycosis      Plan:  Patient was evaluated and treated and all questions answered.  The nail was severely dystrophic and mycotic.  I recommended temporary total nail avulsion allow the nail plate to regrow under antifungal administration.  Following digital block and prep with Betadine the left hallux nail was removed completely and dressed with a bandage with antibiotic ointment.  Soaking instructions given.  Fluconazole 39-monthpulse therapy prescription given as well.  Return in 6 months to reevaluate  Return in about 6 months (around 04/26/2021) for nail check left, follow up after nail fungus treatment.

## 2020-10-30 DIAGNOSIS — F902 Attention-deficit hyperactivity disorder, combined type: Secondary | ICD-10-CM | POA: Diagnosis not present

## 2020-10-31 DIAGNOSIS — N528 Other male erectile dysfunction: Secondary | ICD-10-CM | POA: Diagnosis not present

## 2020-11-04 DIAGNOSIS — G4733 Obstructive sleep apnea (adult) (pediatric): Secondary | ICD-10-CM | POA: Diagnosis not present

## 2020-11-14 DIAGNOSIS — F902 Attention-deficit hyperactivity disorder, combined type: Secondary | ICD-10-CM | POA: Diagnosis not present

## 2020-12-04 DIAGNOSIS — F902 Attention-deficit hyperactivity disorder, combined type: Secondary | ICD-10-CM | POA: Diagnosis not present

## 2020-12-05 DIAGNOSIS — G4733 Obstructive sleep apnea (adult) (pediatric): Secondary | ICD-10-CM | POA: Diagnosis not present

## 2020-12-18 DIAGNOSIS — F902 Attention-deficit hyperactivity disorder, combined type: Secondary | ICD-10-CM | POA: Diagnosis not present

## 2021-01-02 DIAGNOSIS — Z9884 Bariatric surgery status: Secondary | ICD-10-CM | POA: Diagnosis not present

## 2021-01-02 DIAGNOSIS — F902 Attention-deficit hyperactivity disorder, combined type: Secondary | ICD-10-CM | POA: Diagnosis not present

## 2021-01-02 DIAGNOSIS — Z789 Other specified health status: Secondary | ICD-10-CM | POA: Diagnosis not present

## 2021-01-02 DIAGNOSIS — E663 Overweight: Secondary | ICD-10-CM | POA: Diagnosis not present

## 2021-01-02 DIAGNOSIS — E1136 Type 2 diabetes mellitus with diabetic cataract: Secondary | ICD-10-CM | POA: Diagnosis not present

## 2021-01-03 DIAGNOSIS — G4733 Obstructive sleep apnea (adult) (pediatric): Secondary | ICD-10-CM | POA: Diagnosis not present

## 2021-01-04 DIAGNOSIS — G4733 Obstructive sleep apnea (adult) (pediatric): Secondary | ICD-10-CM | POA: Diagnosis not present

## 2021-01-15 DIAGNOSIS — F902 Attention-deficit hyperactivity disorder, combined type: Secondary | ICD-10-CM | POA: Diagnosis not present

## 2021-01-30 DIAGNOSIS — F902 Attention-deficit hyperactivity disorder, combined type: Secondary | ICD-10-CM | POA: Diagnosis not present

## 2021-02-04 DIAGNOSIS — G4733 Obstructive sleep apnea (adult) (pediatric): Secondary | ICD-10-CM | POA: Diagnosis not present

## 2021-02-13 DIAGNOSIS — F902 Attention-deficit hyperactivity disorder, combined type: Secondary | ICD-10-CM | POA: Diagnosis not present

## 2021-03-04 DIAGNOSIS — F902 Attention-deficit hyperactivity disorder, combined type: Secondary | ICD-10-CM | POA: Diagnosis not present

## 2021-03-06 DIAGNOSIS — G4733 Obstructive sleep apnea (adult) (pediatric): Secondary | ICD-10-CM | POA: Diagnosis not present

## 2021-03-19 DIAGNOSIS — F902 Attention-deficit hyperactivity disorder, combined type: Secondary | ICD-10-CM | POA: Diagnosis not present

## 2021-04-03 DIAGNOSIS — F902 Attention-deficit hyperactivity disorder, combined type: Secondary | ICD-10-CM | POA: Diagnosis not present

## 2021-04-06 DIAGNOSIS — G4733 Obstructive sleep apnea (adult) (pediatric): Secondary | ICD-10-CM | POA: Diagnosis not present

## 2021-04-17 DIAGNOSIS — F902 Attention-deficit hyperactivity disorder, combined type: Secondary | ICD-10-CM | POA: Diagnosis not present

## 2021-04-28 DIAGNOSIS — R3915 Urgency of urination: Secondary | ICD-10-CM | POA: Diagnosis not present

## 2021-04-28 DIAGNOSIS — Z85038 Personal history of other malignant neoplasm of large intestine: Secondary | ICD-10-CM | POA: Diagnosis not present

## 2021-04-28 DIAGNOSIS — N528 Other male erectile dysfunction: Secondary | ICD-10-CM | POA: Diagnosis not present

## 2021-04-28 DIAGNOSIS — N401 Enlarged prostate with lower urinary tract symptoms: Secondary | ICD-10-CM | POA: Diagnosis not present

## 2021-05-01 DIAGNOSIS — F902 Attention-deficit hyperactivity disorder, combined type: Secondary | ICD-10-CM | POA: Diagnosis not present

## 2021-05-07 DIAGNOSIS — G4733 Obstructive sleep apnea (adult) (pediatric): Secondary | ICD-10-CM | POA: Diagnosis not present

## 2021-05-08 ENCOUNTER — Other Ambulatory Visit: Payer: Self-pay | Admitting: Urology

## 2021-05-08 DIAGNOSIS — C189 Malignant neoplasm of colon, unspecified: Secondary | ICD-10-CM

## 2021-05-19 DIAGNOSIS — F902 Attention-deficit hyperactivity disorder, combined type: Secondary | ICD-10-CM | POA: Diagnosis not present

## 2021-06-03 ENCOUNTER — Ambulatory Visit
Admission: RE | Admit: 2021-06-03 | Discharge: 2021-06-03 | Disposition: A | Discharge status: Home / Self Care | Payer: Self-pay | Source: Ambulatory Visit | Attending: Urology | Admitting: Urology

## 2021-06-03 ENCOUNTER — Other Ambulatory Visit: Payer: Self-pay

## 2021-06-03 DIAGNOSIS — C189 Malignant neoplasm of colon, unspecified: Secondary | ICD-10-CM

## 2021-06-03 DIAGNOSIS — K4020 Bilateral inguinal hernia, without obstruction or gangrene, not specified as recurrent: Secondary | ICD-10-CM | POA: Diagnosis not present

## 2021-06-03 MED ORDER — GADOBENATE DIMEGLUMINE 529 MG/ML IV SOLN
19.0000 mL | Freq: Once | INTRAVENOUS | Status: AC | PRN
  Administered 2021-06-03: 19 mL via INTRAVENOUS

## 2021-06-04 DIAGNOSIS — G4733 Obstructive sleep apnea (adult) (pediatric): Secondary | ICD-10-CM | POA: Diagnosis not present

## 2021-06-11 DIAGNOSIS — N528 Other male erectile dysfunction: Secondary | ICD-10-CM | POA: Diagnosis not present

## 2021-06-12 DIAGNOSIS — F902 Attention-deficit hyperactivity disorder, combined type: Secondary | ICD-10-CM | POA: Diagnosis not present

## 2021-06-26 DIAGNOSIS — F902 Attention-deficit hyperactivity disorder, combined type: Secondary | ICD-10-CM | POA: Diagnosis not present

## 2021-07-05 DIAGNOSIS — G4733 Obstructive sleep apnea (adult) (pediatric): Secondary | ICD-10-CM | POA: Diagnosis not present

## 2021-07-08 DIAGNOSIS — E1136 Type 2 diabetes mellitus with diabetic cataract: Secondary | ICD-10-CM | POA: Diagnosis not present

## 2021-07-08 DIAGNOSIS — Z9884 Bariatric surgery status: Secondary | ICD-10-CM | POA: Diagnosis not present

## 2021-07-17 DIAGNOSIS — F902 Attention-deficit hyperactivity disorder, combined type: Secondary | ICD-10-CM | POA: Diagnosis not present

## 2021-07-31 DIAGNOSIS — F902 Attention-deficit hyperactivity disorder, combined type: Secondary | ICD-10-CM | POA: Diagnosis not present

## 2021-08-14 DIAGNOSIS — F902 Attention-deficit hyperactivity disorder, combined type: Secondary | ICD-10-CM | POA: Diagnosis not present

## 2021-08-28 DIAGNOSIS — F902 Attention-deficit hyperactivity disorder, combined type: Secondary | ICD-10-CM | POA: Diagnosis not present

## 2021-09-04 DIAGNOSIS — G4733 Obstructive sleep apnea (adult) (pediatric): Secondary | ICD-10-CM | POA: Diagnosis not present

## 2021-09-10 DIAGNOSIS — F902 Attention-deficit hyperactivity disorder, combined type: Secondary | ICD-10-CM | POA: Diagnosis not present

## 2021-09-30 DIAGNOSIS — F902 Attention-deficit hyperactivity disorder, combined type: Secondary | ICD-10-CM | POA: Diagnosis not present

## 2021-10-04 DIAGNOSIS — G4733 Obstructive sleep apnea (adult) (pediatric): Secondary | ICD-10-CM | POA: Diagnosis not present

## 2021-10-15 DIAGNOSIS — F902 Attention-deficit hyperactivity disorder, combined type: Secondary | ICD-10-CM | POA: Diagnosis not present

## 2021-11-03 DIAGNOSIS — F902 Attention-deficit hyperactivity disorder, combined type: Secondary | ICD-10-CM | POA: Diagnosis not present

## 2021-11-04 DIAGNOSIS — G4733 Obstructive sleep apnea (adult) (pediatric): Secondary | ICD-10-CM | POA: Diagnosis not present

## 2021-11-20 DIAGNOSIS — F902 Attention-deficit hyperactivity disorder, combined type: Secondary | ICD-10-CM | POA: Diagnosis not present

## 2021-12-05 DIAGNOSIS — F902 Attention-deficit hyperactivity disorder, combined type: Secondary | ICD-10-CM | POA: Diagnosis not present

## 2021-12-11 DIAGNOSIS — N528 Other male erectile dysfunction: Secondary | ICD-10-CM | POA: Diagnosis not present

## 2021-12-15 DIAGNOSIS — U071 COVID-19: Secondary | ICD-10-CM | POA: Diagnosis not present

## 2021-12-22 DIAGNOSIS — F902 Attention-deficit hyperactivity disorder, combined type: Secondary | ICD-10-CM | POA: Diagnosis not present

## 2021-12-23 DIAGNOSIS — G4733 Obstructive sleep apnea (adult) (pediatric): Secondary | ICD-10-CM | POA: Diagnosis not present

## 2022-01-05 DIAGNOSIS — F902 Attention-deficit hyperactivity disorder, combined type: Secondary | ICD-10-CM | POA: Diagnosis not present

## 2022-01-08 DIAGNOSIS — E1136 Type 2 diabetes mellitus with diabetic cataract: Secondary | ICD-10-CM | POA: Diagnosis not present

## 2022-01-08 DIAGNOSIS — Z789 Other specified health status: Secondary | ICD-10-CM | POA: Diagnosis not present

## 2022-01-08 DIAGNOSIS — Z9884 Bariatric surgery status: Secondary | ICD-10-CM | POA: Diagnosis not present

## 2022-01-19 DIAGNOSIS — N528 Other male erectile dysfunction: Secondary | ICD-10-CM | POA: Diagnosis not present

## 2022-01-20 DIAGNOSIS — F902 Attention-deficit hyperactivity disorder, combined type: Secondary | ICD-10-CM | POA: Diagnosis not present

## 2022-01-22 DIAGNOSIS — G4733 Obstructive sleep apnea (adult) (pediatric): Secondary | ICD-10-CM | POA: Diagnosis not present

## 2022-02-03 DIAGNOSIS — F902 Attention-deficit hyperactivity disorder, combined type: Secondary | ICD-10-CM | POA: Diagnosis not present

## 2022-02-17 DIAGNOSIS — F902 Attention-deficit hyperactivity disorder, combined type: Secondary | ICD-10-CM | POA: Diagnosis not present

## 2022-02-22 DIAGNOSIS — G4733 Obstructive sleep apnea (adult) (pediatric): Secondary | ICD-10-CM | POA: Diagnosis not present

## 2022-03-02 DIAGNOSIS — N528 Other male erectile dysfunction: Secondary | ICD-10-CM | POA: Diagnosis not present

## 2022-03-05 DIAGNOSIS — F902 Attention-deficit hyperactivity disorder, combined type: Secondary | ICD-10-CM | POA: Diagnosis not present

## 2022-03-13 ENCOUNTER — Encounter (HOSPITAL_COMMUNITY): Payer: Self-pay | Admitting: *Deleted

## 2022-03-24 DIAGNOSIS — G4733 Obstructive sleep apnea (adult) (pediatric): Secondary | ICD-10-CM | POA: Diagnosis not present

## 2022-03-24 DIAGNOSIS — E669 Obesity, unspecified: Secondary | ICD-10-CM | POA: Diagnosis not present

## 2022-03-24 DIAGNOSIS — Z903 Acquired absence of stomach [part of]: Secondary | ICD-10-CM | POA: Diagnosis not present

## 2022-03-24 DIAGNOSIS — F902 Attention-deficit hyperactivity disorder, combined type: Secondary | ICD-10-CM | POA: Diagnosis not present

## 2022-03-25 ENCOUNTER — Encounter: Payer: Self-pay | Admitting: *Deleted

## 2022-04-13 DIAGNOSIS — F902 Attention-deficit hyperactivity disorder, combined type: Secondary | ICD-10-CM | POA: Diagnosis not present

## 2022-04-24 DIAGNOSIS — G4733 Obstructive sleep apnea (adult) (pediatric): Secondary | ICD-10-CM | POA: Diagnosis not present

## 2022-04-29 DIAGNOSIS — F902 Attention-deficit hyperactivity disorder, combined type: Secondary | ICD-10-CM | POA: Diagnosis not present

## 2022-05-14 ENCOUNTER — Other Ambulatory Visit (HOSPITAL_COMMUNITY): Payer: Self-pay

## 2022-05-14 MED ORDER — TRULICITY 1.5 MG/0.5ML ~~LOC~~ SOAJ
1.5000 mg | SUBCUTANEOUS | 5 refills | Status: AC
  Filled 2022-05-14: qty 2, 28d supply, fill #0
  Filled 2022-06-23: qty 2, 28d supply, fill #1
  Filled 2022-10-06: qty 2, 28d supply, fill #2

## 2022-05-18 DIAGNOSIS — F902 Attention-deficit hyperactivity disorder, combined type: Secondary | ICD-10-CM | POA: Diagnosis not present

## 2022-05-25 DIAGNOSIS — G4733 Obstructive sleep apnea (adult) (pediatric): Secondary | ICD-10-CM | POA: Diagnosis not present

## 2022-06-01 DIAGNOSIS — N528 Other male erectile dysfunction: Secondary | ICD-10-CM | POA: Diagnosis not present

## 2022-06-02 DIAGNOSIS — F902 Attention-deficit hyperactivity disorder, combined type: Secondary | ICD-10-CM | POA: Diagnosis not present

## 2022-06-18 DIAGNOSIS — F902 Attention-deficit hyperactivity disorder, combined type: Secondary | ICD-10-CM | POA: Diagnosis not present

## 2022-06-23 ENCOUNTER — Other Ambulatory Visit (HOSPITAL_COMMUNITY): Payer: Self-pay

## 2022-06-23 DIAGNOSIS — G4733 Obstructive sleep apnea (adult) (pediatric): Secondary | ICD-10-CM | POA: Diagnosis not present

## 2022-07-02 DIAGNOSIS — F902 Attention-deficit hyperactivity disorder, combined type: Secondary | ICD-10-CM | POA: Diagnosis not present

## 2022-07-10 DIAGNOSIS — E669 Obesity, unspecified: Secondary | ICD-10-CM | POA: Diagnosis not present

## 2022-07-10 DIAGNOSIS — E1136 Type 2 diabetes mellitus with diabetic cataract: Secondary | ICD-10-CM | POA: Diagnosis not present

## 2022-07-20 DIAGNOSIS — F902 Attention-deficit hyperactivity disorder, combined type: Secondary | ICD-10-CM | POA: Diagnosis not present

## 2022-07-24 DIAGNOSIS — G4733 Obstructive sleep apnea (adult) (pediatric): Secondary | ICD-10-CM | POA: Diagnosis not present

## 2022-08-05 DIAGNOSIS — F902 Attention-deficit hyperactivity disorder, combined type: Secondary | ICD-10-CM | POA: Diagnosis not present

## 2022-08-20 DIAGNOSIS — F902 Attention-deficit hyperactivity disorder, combined type: Secondary | ICD-10-CM | POA: Diagnosis not present

## 2022-08-23 DIAGNOSIS — G4733 Obstructive sleep apnea (adult) (pediatric): Secondary | ICD-10-CM | POA: Diagnosis not present

## 2022-09-03 DIAGNOSIS — F902 Attention-deficit hyperactivity disorder, combined type: Secondary | ICD-10-CM | POA: Diagnosis not present

## 2022-09-17 DIAGNOSIS — F902 Attention-deficit hyperactivity disorder, combined type: Secondary | ICD-10-CM | POA: Diagnosis not present

## 2022-09-22 DIAGNOSIS — G4733 Obstructive sleep apnea (adult) (pediatric): Secondary | ICD-10-CM | POA: Diagnosis not present

## 2022-09-24 DIAGNOSIS — G4733 Obstructive sleep apnea (adult) (pediatric): Secondary | ICD-10-CM | POA: Diagnosis not present

## 2022-10-01 DIAGNOSIS — F902 Attention-deficit hyperactivity disorder, combined type: Secondary | ICD-10-CM | POA: Diagnosis not present

## 2022-10-06 ENCOUNTER — Other Ambulatory Visit (HOSPITAL_COMMUNITY): Payer: Self-pay

## 2022-10-13 DIAGNOSIS — Z9884 Bariatric surgery status: Secondary | ICD-10-CM | POA: Diagnosis not present

## 2022-10-13 DIAGNOSIS — Z789 Other specified health status: Secondary | ICD-10-CM | POA: Diagnosis not present

## 2022-10-13 DIAGNOSIS — E1136 Type 2 diabetes mellitus with diabetic cataract: Secondary | ICD-10-CM | POA: Diagnosis not present

## 2022-10-13 DIAGNOSIS — E669 Obesity, unspecified: Secondary | ICD-10-CM | POA: Diagnosis not present

## 2022-10-19 DIAGNOSIS — F902 Attention-deficit hyperactivity disorder, combined type: Secondary | ICD-10-CM | POA: Diagnosis not present

## 2022-10-22 DIAGNOSIS — G4733 Obstructive sleep apnea (adult) (pediatric): Secondary | ICD-10-CM | POA: Diagnosis not present

## 2022-11-04 DIAGNOSIS — F902 Attention-deficit hyperactivity disorder, combined type: Secondary | ICD-10-CM | POA: Diagnosis not present

## 2022-11-18 DIAGNOSIS — F902 Attention-deficit hyperactivity disorder, combined type: Secondary | ICD-10-CM | POA: Diagnosis not present

## 2022-11-22 DIAGNOSIS — G4733 Obstructive sleep apnea (adult) (pediatric): Secondary | ICD-10-CM | POA: Diagnosis not present

## 2022-12-03 DIAGNOSIS — F902 Attention-deficit hyperactivity disorder, combined type: Secondary | ICD-10-CM | POA: Diagnosis not present

## 2022-12-21 DIAGNOSIS — F902 Attention-deficit hyperactivity disorder, combined type: Secondary | ICD-10-CM | POA: Diagnosis not present

## 2022-12-21 DIAGNOSIS — G4733 Obstructive sleep apnea (adult) (pediatric): Secondary | ICD-10-CM | POA: Diagnosis not present

## 2022-12-25 DIAGNOSIS — G4733 Obstructive sleep apnea (adult) (pediatric): Secondary | ICD-10-CM | POA: Diagnosis not present

## 2023-01-04 DIAGNOSIS — F902 Attention-deficit hyperactivity disorder, combined type: Secondary | ICD-10-CM | POA: Diagnosis not present

## 2023-01-20 DIAGNOSIS — G4733 Obstructive sleep apnea (adult) (pediatric): Secondary | ICD-10-CM | POA: Diagnosis not present

## 2023-01-21 DIAGNOSIS — F902 Attention-deficit hyperactivity disorder, combined type: Secondary | ICD-10-CM | POA: Diagnosis not present

## 2023-02-04 DIAGNOSIS — F902 Attention-deficit hyperactivity disorder, combined type: Secondary | ICD-10-CM | POA: Diagnosis not present

## 2023-02-18 DIAGNOSIS — F902 Attention-deficit hyperactivity disorder, combined type: Secondary | ICD-10-CM | POA: Diagnosis not present

## 2023-02-20 DIAGNOSIS — G4733 Obstructive sleep apnea (adult) (pediatric): Secondary | ICD-10-CM | POA: Diagnosis not present

## 2023-02-25 DIAGNOSIS — N529 Male erectile dysfunction, unspecified: Secondary | ICD-10-CM | POA: Diagnosis not present

## 2023-02-25 DIAGNOSIS — R3912 Poor urinary stream: Secondary | ICD-10-CM | POA: Diagnosis not present

## 2023-02-25 DIAGNOSIS — N401 Enlarged prostate with lower urinary tract symptoms: Secondary | ICD-10-CM | POA: Diagnosis not present

## 2023-03-08 DIAGNOSIS — F902 Attention-deficit hyperactivity disorder, combined type: Secondary | ICD-10-CM | POA: Diagnosis not present

## 2023-03-18 DIAGNOSIS — S39012A Strain of muscle, fascia and tendon of lower back, initial encounter: Secondary | ICD-10-CM | POA: Diagnosis not present

## 2023-03-18 DIAGNOSIS — R0981 Nasal congestion: Secondary | ICD-10-CM | POA: Diagnosis not present

## 2023-03-22 DIAGNOSIS — G4733 Obstructive sleep apnea (adult) (pediatric): Secondary | ICD-10-CM | POA: Diagnosis not present

## 2023-03-25 DIAGNOSIS — F902 Attention-deficit hyperactivity disorder, combined type: Secondary | ICD-10-CM | POA: Diagnosis not present

## 2023-03-26 DIAGNOSIS — G4733 Obstructive sleep apnea (adult) (pediatric): Secondary | ICD-10-CM | POA: Diagnosis not present

## 2023-04-08 DIAGNOSIS — F902 Attention-deficit hyperactivity disorder, combined type: Secondary | ICD-10-CM | POA: Diagnosis not present

## 2023-04-16 DIAGNOSIS — Z789 Other specified health status: Secondary | ICD-10-CM | POA: Diagnosis not present

## 2023-04-16 DIAGNOSIS — E1165 Type 2 diabetes mellitus with hyperglycemia: Secondary | ICD-10-CM | POA: Diagnosis not present

## 2023-04-16 DIAGNOSIS — E1136 Type 2 diabetes mellitus with diabetic cataract: Secondary | ICD-10-CM | POA: Diagnosis not present

## 2023-04-16 DIAGNOSIS — Z9884 Bariatric surgery status: Secondary | ICD-10-CM | POA: Diagnosis not present

## 2023-04-16 DIAGNOSIS — E669 Obesity, unspecified: Secondary | ICD-10-CM | POA: Diagnosis not present

## 2023-04-22 DIAGNOSIS — G4733 Obstructive sleep apnea (adult) (pediatric): Secondary | ICD-10-CM | POA: Diagnosis not present

## 2023-05-23 DIAGNOSIS — G4733 Obstructive sleep apnea (adult) (pediatric): Secondary | ICD-10-CM | POA: Diagnosis not present

## 2023-05-28 DIAGNOSIS — E1136 Type 2 diabetes mellitus with diabetic cataract: Secondary | ICD-10-CM | POA: Diagnosis not present

## 2023-05-28 DIAGNOSIS — Z0000 Encounter for general adult medical examination without abnormal findings: Secondary | ICD-10-CM | POA: Diagnosis not present

## 2023-05-28 DIAGNOSIS — Z125 Encounter for screening for malignant neoplasm of prostate: Secondary | ICD-10-CM | POA: Diagnosis not present

## 2023-05-28 DIAGNOSIS — Z23 Encounter for immunization: Secondary | ICD-10-CM | POA: Diagnosis not present

## 2023-05-28 DIAGNOSIS — F4010 Social phobia, unspecified: Secondary | ICD-10-CM | POA: Diagnosis not present

## 2023-05-28 DIAGNOSIS — Z1211 Encounter for screening for malignant neoplasm of colon: Secondary | ICD-10-CM | POA: Diagnosis not present

## 2023-06-04 DIAGNOSIS — F4010 Social phobia, unspecified: Secondary | ICD-10-CM | POA: Diagnosis not present

## 2023-06-11 DIAGNOSIS — F4010 Social phobia, unspecified: Secondary | ICD-10-CM | POA: Diagnosis not present

## 2023-06-18 DIAGNOSIS — F4010 Social phobia, unspecified: Secondary | ICD-10-CM | POA: Diagnosis not present

## 2023-06-22 DIAGNOSIS — G4733 Obstructive sleep apnea (adult) (pediatric): Secondary | ICD-10-CM | POA: Diagnosis not present

## 2023-06-25 DIAGNOSIS — F4010 Social phobia, unspecified: Secondary | ICD-10-CM | POA: Diagnosis not present

## 2023-06-29 DIAGNOSIS — G4733 Obstructive sleep apnea (adult) (pediatric): Secondary | ICD-10-CM | POA: Diagnosis not present

## 2023-07-02 DIAGNOSIS — F4010 Social phobia, unspecified: Secondary | ICD-10-CM | POA: Diagnosis not present

## 2023-07-09 DIAGNOSIS — F4010 Social phobia, unspecified: Secondary | ICD-10-CM | POA: Diagnosis not present

## 2023-07-15 DIAGNOSIS — E663 Overweight: Secondary | ICD-10-CM | POA: Diagnosis not present

## 2023-07-15 DIAGNOSIS — E1136 Type 2 diabetes mellitus with diabetic cataract: Secondary | ICD-10-CM | POA: Diagnosis not present

## 2023-07-15 DIAGNOSIS — Z9884 Bariatric surgery status: Secondary | ICD-10-CM | POA: Diagnosis not present

## 2023-07-15 DIAGNOSIS — Z789 Other specified health status: Secondary | ICD-10-CM | POA: Diagnosis not present

## 2023-07-16 DIAGNOSIS — F4010 Social phobia, unspecified: Secondary | ICD-10-CM | POA: Diagnosis not present

## 2023-07-23 DIAGNOSIS — G4733 Obstructive sleep apnea (adult) (pediatric): Secondary | ICD-10-CM | POA: Diagnosis not present

## 2023-07-30 DIAGNOSIS — F4010 Social phobia, unspecified: Secondary | ICD-10-CM | POA: Diagnosis not present

## 2023-08-06 DIAGNOSIS — F4010 Social phobia, unspecified: Secondary | ICD-10-CM | POA: Diagnosis not present

## 2023-08-20 DIAGNOSIS — F4010 Social phobia, unspecified: Secondary | ICD-10-CM | POA: Diagnosis not present

## 2023-08-22 DIAGNOSIS — G4733 Obstructive sleep apnea (adult) (pediatric): Secondary | ICD-10-CM | POA: Diagnosis not present

## 2023-08-27 DIAGNOSIS — F4010 Social phobia, unspecified: Secondary | ICD-10-CM | POA: Diagnosis not present

## 2023-09-02 ENCOUNTER — Ambulatory Visit: Admitting: Plastic Surgery

## 2023-09-02 ENCOUNTER — Encounter: Payer: Self-pay | Admitting: Plastic Surgery

## 2023-09-02 VITALS — BP 103/67 | HR 81 | Ht 69.0 in | Wt 176.8 lb

## 2023-09-02 DIAGNOSIS — E65 Localized adiposity: Secondary | ICD-10-CM

## 2023-09-02 DIAGNOSIS — Z9884 Bariatric surgery status: Secondary | ICD-10-CM

## 2023-09-02 DIAGNOSIS — I868 Varicose veins of other specified sites: Secondary | ICD-10-CM

## 2023-09-02 NOTE — Progress Notes (Signed)
 Referring Provider [name], Laurette Pool, MD [address] [place],  [place] [address]   CC:  Chief Complaint  Patient presents with   consult      James Buck is an 63 y.o. male.  HPI: James Buck is a 63 year old male who underwent bariatric surgery in 2019.  Since that time he has had a weight loss of approximately 70 to 75 pounds.  He was referred today for discussion of abdominal wall contouring.  He does have a history of multiple abdominal surgeries in addition to his bariatric surgery.  He had colon cancer with a low midline incision as well as a left lower quadrant incision for a colostomy.  He had a recurrence and had a second intra-abdominal procedure.  Allergies  Allergen Reactions   Statins Other (See Comments)    Body aches   Victoza [Liraglutide] Nausea Only    Prior nausea when taken in AM (tolerates bedtime dosing with no difficulty)    Outpatient Encounter Medications as of 09/02/2023  Medication Sig   fluticasone  (FLONASE ) 50 MCG/ACT nasal spray Place 2 sprays into the nose daily. Reported on 04/24/2015   glucose blood test strip 1 each. Use as instructed. Check cbg q am, alternatinf between AM and PM mels. AC   QID   metFORMIN (GLUCOPHAGE) 500 MG tablet 1000 mcg x 2   MOUNJARO 10 MG/0.5ML Pen 10 mg Subcutaneous Once a week   Multiple Vitamins-Minerals (BARIATRIC MULTIVITAMINS/IRON) CAPS See admin instructions.   Dulaglutide  (TRULICITY ) 1.5 MG/0.5ML SOPN Inject 1.5 mg into the skin once a week.   oxymetazoline (AFRIN) 0.05 % nasal spray Place 1 spray into both nostrils 2 (two) times daily as needed for congestion. (Patient not taking: Reported on 09/02/2023)   pantoprazole  (PROTONIX ) 40 MG tablet Take 1 tablet (40 mg total) by mouth daily.   pseudoephedrine (SUDAFED) 30 MG tablet Take 30 mg by mouth every 4 (four) hours as needed for congestion. (Patient not taking: Reported on 09/02/2023)   TRULICITY  0.75 MG/0.5ML SOPN Inject into the skin.   No  facility-administered encounter medications on file as of 09/02/2023.     Past Medical History:  Diagnosis Date   Allergy    seasonal   Cancer [facility]) colon   Complication of anesthesia    woke up one time still intubated- port for chemotherapy   Diabetes mellitus without complication (HCC)    Diabetic neuropathy (HCC)    H/O colonoscopy    OSA on CPAP    Osteoarthritis    knees   Peripheral neuropathy    Peripheral neuropathy    Seasonal allergies    Sinus tachycardia    Sleep apnea    wears cpap    Vitamin B12 deficiency    Vitamin D deficiency     Past Surgical History:  Procedure Laterality Date   angiograms  2008   2 venograms  for SVC blockages   colon cancer resection  2007    chemotherapy, 2/12 malignant abdominal LN (more chemotherapy) 4/12-9/12.    COLON RESECTION  2012   FOLFOX - all complete since 2012   COLONOSCOPY     COLOSTOMY REVISION     COLOSTOMY TAKEDOWN  2008   colostomy in 2007   EYE SURGERY     radial carototomy-right eye   EYE SURGERY     bilateral cataract surgery with lens implants   KNEE SURGERY Right 2007   LAPAROSCOPIC GASTRIC SLEEVE RESECTION N/A 08/16/2017   Procedure: LAPAROSCOPIC GASTRIC SLEEVE RESECTION  WITH HIATAL HERNIA REPAIR, UPPER ENDOSCOPY;  Surgeon: Aldean Hummingbird, MD;  Location: [facility];  Service: General;  Laterality: N/A;   porto cath     placement 2012 removed 12/2010   VASECTOMY      Family History  Problem Relation Age of Onset   Lymphoma Father    Cancer Other    COPD Other    Hypertension Other    Colon cancer Neg Hx    Colon polyps Neg Hx    Esophageal cancer Neg Hx    Rectal cancer Neg Hx    Stomach cancer Neg Hx     Social History   Social History Narrative   Patient is retired.   High school education.    Patient has two children.   Caffeine consumption is 2 daily   Patient is right handed     Review of Systems General: Denies fevers, chills, weight loss CV: Denies chest pain, shortness of breath,  palpitations Abdomen: Excess skin and fat, well-healed scars.  No pain or discomfort.  Physical Exam    09/02/2023   11:00 AM 08/26/2020    1:24 PM 02/21/2018    3:56 PM  Vitals with BMI  Height 5\' 9"  5\' 9"  5\' 10"   Weight 176 lbs 13 oz 202 lbs   BMI 26.1 29.82   Systolic 103 137   Diastolic 67 72   Pulse 81 69     General:  No acute distress,  Alert and oriented, Non-Toxic, Normal speech and affect Abdomen: Patient has a small amount of excess skin and fat mostly above his umbilicus.  He does not have a pannus.  He does have several well-healed scars from previous surgeries including the low midline incision in the left lower quadrant incision from his colostomy.  Of note he has what appears to be varicosities of the anterior abdominal wall.   Assessment/Plan Weight loss after bariatric surgery: Patient has a small amount of excess skin of fat on the anterior abdominal wall.  He does not have a pannus and he has nothing that would be covered by insurance.  On evaluation of his abdominal wall I am very concerned about the varicosities.  I have strongly recommended to him that he not have any type of abdominal wall contouring.  Additionally I do believe that this should be evaluated as to what the etiology is for what appeared to be varicosities.  I have offered to order an ultrasound of the anterior abdominal wall but have strongly encouraged that he return to his primary care physician for further evaluation and management.  Follow-up as needed.  James Buck 09/02/2023, 11:19 AM

## 2023-09-03 DIAGNOSIS — F4010 Social phobia, unspecified: Secondary | ICD-10-CM | POA: Diagnosis not present

## 2023-09-10 DIAGNOSIS — F4010 Social phobia, unspecified: Secondary | ICD-10-CM | POA: Diagnosis not present

## 2023-09-17 DIAGNOSIS — F4010 Social phobia, unspecified: Secondary | ICD-10-CM | POA: Diagnosis not present

## 2023-09-22 DIAGNOSIS — R809 Proteinuria, unspecified: Secondary | ICD-10-CM | POA: Diagnosis not present

## 2023-09-23 DIAGNOSIS — G4733 Obstructive sleep apnea (adult) (pediatric): Secondary | ICD-10-CM | POA: Diagnosis not present

## 2023-09-29 DIAGNOSIS — G4733 Obstructive sleep apnea (adult) (pediatric): Secondary | ICD-10-CM | POA: Diagnosis not present

## 2023-10-01 DIAGNOSIS — F4010 Social phobia, unspecified: Secondary | ICD-10-CM | POA: Diagnosis not present

## 2023-10-13 DIAGNOSIS — Z5181 Encounter for therapeutic drug level monitoring: Secondary | ICD-10-CM | POA: Diagnosis not present

## 2023-10-15 DIAGNOSIS — F4010 Social phobia, unspecified: Secondary | ICD-10-CM | POA: Diagnosis not present

## 2023-10-20 ENCOUNTER — Ambulatory Visit (HOSPITAL_COMMUNITY)
Admission: RE | Admit: 2023-10-20 | Discharge: 2023-10-20 | Disposition: A | Discharge status: Home / Self Care | Source: Ambulatory Visit | Attending: Plastic Surgery | Admitting: Plastic Surgery

## 2023-10-20 DIAGNOSIS — I868 Varicose veins of other specified sites: Secondary | ICD-10-CM | POA: Insufficient documentation

## 2023-10-22 DIAGNOSIS — F4010 Social phobia, unspecified: Secondary | ICD-10-CM | POA: Diagnosis not present

## 2023-10-23 DIAGNOSIS — G4733 Obstructive sleep apnea (adult) (pediatric): Secondary | ICD-10-CM | POA: Diagnosis not present

## 2023-10-29 DIAGNOSIS — F4010 Social phobia, unspecified: Secondary | ICD-10-CM | POA: Diagnosis not present

## 2023-11-04 DIAGNOSIS — Z6825 Body mass index (BMI) 25.0-25.9, adult: Secondary | ICD-10-CM | POA: Diagnosis not present

## 2023-11-04 DIAGNOSIS — I868 Varicose veins of other specified sites: Secondary | ICD-10-CM | POA: Diagnosis not present

## 2023-11-12 DIAGNOSIS — F4010 Social phobia, unspecified: Secondary | ICD-10-CM | POA: Diagnosis not present

## 2023-11-18 DIAGNOSIS — Z980 Intestinal bypass and anastomosis status: Secondary | ICD-10-CM | POA: Diagnosis not present

## 2023-11-18 DIAGNOSIS — Z85038 Personal history of other malignant neoplasm of large intestine: Secondary | ICD-10-CM | POA: Diagnosis not present

## 2023-11-18 DIAGNOSIS — D122 Benign neoplasm of ascending colon: Secondary | ICD-10-CM | POA: Diagnosis not present

## 2023-11-18 DIAGNOSIS — Z08 Encounter for follow-up examination after completed treatment for malignant neoplasm: Secondary | ICD-10-CM | POA: Diagnosis not present

## 2023-11-18 DIAGNOSIS — K6289 Other specified diseases of anus and rectum: Secondary | ICD-10-CM | POA: Diagnosis not present

## 2023-11-19 DIAGNOSIS — F4010 Social phobia, unspecified: Secondary | ICD-10-CM | POA: Diagnosis not present

## 2023-11-23 DIAGNOSIS — G4733 Obstructive sleep apnea (adult) (pediatric): Secondary | ICD-10-CM | POA: Diagnosis not present

## 2023-11-26 DIAGNOSIS — F4010 Social phobia, unspecified: Secondary | ICD-10-CM | POA: Diagnosis not present

## 2023-12-03 DIAGNOSIS — F4010 Social phobia, unspecified: Secondary | ICD-10-CM | POA: Diagnosis not present

## 2023-12-08 DIAGNOSIS — D235 Other benign neoplasm of skin of trunk: Secondary | ICD-10-CM | POA: Diagnosis not present

## 2023-12-17 DIAGNOSIS — F4010 Social phobia, unspecified: Secondary | ICD-10-CM | POA: Diagnosis not present

## 2023-12-23 DIAGNOSIS — G4733 Obstructive sleep apnea (adult) (pediatric): Secondary | ICD-10-CM | POA: Diagnosis not present

## 2023-12-24 DIAGNOSIS — F4010 Social phobia, unspecified: Secondary | ICD-10-CM | POA: Diagnosis not present

## 2024-01-07 DIAGNOSIS — F4010 Social phobia, unspecified: Secondary | ICD-10-CM | POA: Diagnosis not present

## 2024-01-14 DIAGNOSIS — Z789 Other specified health status: Secondary | ICD-10-CM | POA: Diagnosis not present

## 2024-01-14 DIAGNOSIS — Z9884 Bariatric surgery status: Secondary | ICD-10-CM | POA: Diagnosis not present

## 2024-01-14 DIAGNOSIS — E1136 Type 2 diabetes mellitus with diabetic cataract: Secondary | ICD-10-CM | POA: Diagnosis not present

## 2024-01-14 DIAGNOSIS — E663 Overweight: Secondary | ICD-10-CM | POA: Diagnosis not present

## 2024-01-24 ENCOUNTER — Telehealth: Payer: Self-pay | Admitting: Neurology

## 2024-01-24 NOTE — Telephone Encounter (Signed)
 Received new sleep referral for pt from PCP [facility] Nicholad Tocci PA for OSA. Placed in sleep referrals box

## 2024-01-28 DIAGNOSIS — F4010 Social phobia, unspecified: Secondary | ICD-10-CM | POA: Diagnosis not present

## 2024-02-04 DIAGNOSIS — F4010 Social phobia, unspecified: Secondary | ICD-10-CM | POA: Diagnosis not present

## 2024-02-11 DIAGNOSIS — F4010 Social phobia, unspecified: Secondary | ICD-10-CM | POA: Diagnosis not present

## 2024-02-18 DIAGNOSIS — F4010 Social phobia, unspecified: Secondary | ICD-10-CM | POA: Diagnosis not present

## 2024-02-25 DIAGNOSIS — F4010 Social phobia, unspecified: Secondary | ICD-10-CM | POA: Diagnosis not present

## 2024-02-27 DIAGNOSIS — G4733 Obstructive sleep apnea (adult) (pediatric): Secondary | ICD-10-CM | POA: Diagnosis not present

## 2024-03-15 ENCOUNTER — Encounter (HOSPITAL_COMMUNITY): Payer: Self-pay | Admitting: *Deleted

## 2024-03-16 ENCOUNTER — Institutional Professional Consult (permissible substitution): Admitting: Neurology

## 2024-03-17 ENCOUNTER — Institutional Professional Consult (permissible substitution): Admitting: Neurology

## 2024-03-17 DIAGNOSIS — Z9884 Bariatric surgery status: Secondary | ICD-10-CM | POA: Diagnosis not present

## 2024-03-17 DIAGNOSIS — Z8639 Personal history of other endocrine, nutritional and metabolic disease: Secondary | ICD-10-CM | POA: Diagnosis not present

## 2024-03-17 DIAGNOSIS — E119 Type 2 diabetes mellitus without complications: Secondary | ICD-10-CM | POA: Diagnosis not present

## 2024-03-17 DIAGNOSIS — F4010 Social phobia, unspecified: Secondary | ICD-10-CM | POA: Diagnosis not present

## 2024-03-17 DIAGNOSIS — K9089 Other intestinal malabsorption: Secondary | ICD-10-CM | POA: Diagnosis not present

## 2024-04-04 ENCOUNTER — Encounter: Payer: Self-pay | Admitting: Neurology

## 2024-04-04 ENCOUNTER — Ambulatory Visit: Admitting: Neurology

## 2024-04-04 ENCOUNTER — Institutional Professional Consult (permissible substitution): Admitting: Neurology

## 2024-04-04 VITALS — BP 124/79 | HR 82 | Ht 69.0 in | Wt 180.4 lb

## 2024-04-04 DIAGNOSIS — G4733 Obstructive sleep apnea (adult) (pediatric): Secondary | ICD-10-CM | POA: Diagnosis not present

## 2024-04-04 NOTE — Progress Notes (Signed)
 Subjective:    Patient ID: James Buck is a 63 y.o. male.  HPI    True Mar, MD, PhD [facility] [address], [address] [address] [place], [place] [address]  Dear Mabel,   I saw your patient, James Buck, upon your kind request in my sleep clinic today for evaluation of his sleep apnea.  The patient is unaccompanied today.  As you know, James Buck is a 63 year old male with an underlying medical history of allergies, recurrent sinus issues, vitamin B12 deficiency, vitamin D deficiency, status post colostomy with status post reversal, colon cancer with status post surgery, bariatric surgery in 2019, diabetes, neuropathy, and mildly overweight state who has a longstanding history of sleep apnea since 2009.  He is currently on his third machine, this was issued in August 2022.  I reviewed his AutoPap compliance data for the past 30 days and he has been fully compliant with treatment with an average usage of over 6 hours, residual AHI high at 30/h, leak information possibly erroneous as it says no leak at all.  He does report having leakage from the side of his mask, he uses a nasal mask.  Pressure range is 8 to 14 cm and his average pressure is a little over 12 cm. I had evaluated him for OSA in May 2022. He had interim weight loss in the realm of over 20 lb. He had a HST through our office on 09/18/20 which showed overall mild obstructive sleep apnea by number of events with a total AHI of 9/hour, but a few desaturations into the 80s and higher 70s. O2 nadir was 77%. Mild to moderate snoring was noted.   He reports full compliance with treatment and in fact does not sleep well without his machine.  He lives with his son, they have 1 dog in the household.  He has no nightly nocturia.  He has occasional morning headaches.  Epworth sleepiness score is 5 out of 24, fatigue severity score is 13 out of 63.  He goes to bed generally between 10 and 11 and rise time is  generally between 5:30 AM and 6 AM.  He is a retired it sales professional.  He drinks caffeine in the form of coffee, 2 cups in the mornings, occasional alcohol, less than once a week.  His DME company is [employer].  He does need new supplies.  Previously (copied from previous notes for reference):   08/26/20: 63 year old right-handed gentleman with an underlying medical history of obesity with status post sleeve gastrectomy in May 2019, diabetes, neuropathy (for which he had seen Dr. Onita in our office in 2015), osteoarthritis, allergies, sinus tachycardia, vitamin D deficiency, vitamin B12 deficiency, colon cancer, and allergies, who was previously diagnosed with obstructive sleep apnea and placed on a positive airway pressure treatment.  I reviewed your office note from 07/10/2020.  His Epworth sleepiness score is 8 out of 24, fatigue severity score is 13 out of 63.  He has been on CPAP therapy essentially since 2008 or 2009.  This is his second machine, it is a S9 escape and he had this machine for about 6 or 7 years.  He used to go to [place], [place]  for his sleep specialist and would like to get established locally here.  He has a bedtime between 10 and 11 PM and rise time around 6 AM.  He denies any night to night nocturia or recurrent morning headaches. Prior sleep study results are  not available for my review today.  A CPAP compliance download was reviewed, from 07/27/2020 through 08/25/2020 he used his machine every night with percent use days greater than 4 hours at 100%, average usage of 6 hours and 39 minutes, residual AHI information not available and leak information not available from this machine/download.  Pressure at 14 cm with EPR of 2.  He reports full compliance, he reports that he has used the Mirage FX nasal mask with good success, nasal pillows are difficult to tolerate because of the pressure.  He has lost about 60 pounds since his weight loss surgery, prior to that, he had weight  loss because of his colon cancer diagnosis.  He is a retired it sales professional.  He lives with his son who is 59 years old, he has an older daughter age 66.  He is divorced.  He drinks alcohol occasionally, drinks caffeine in the form of coffee, 2 cups in the mornings and quit smoking in 2006.  He is very motivated to continue with CPAP therapy and has benefited from it significantly.    His Past Medical History Is Significant For: Past Medical History:  Diagnosis Date   Allergy    seasonal   Cancer (HCC) colon   Complication of anesthesia    woke up one time still intubated- port for chemotherapy   Diabetes mellitus without complication (HCC)    Diabetic neuropathy (HCC)    H/O colonoscopy    OSA on CPAP    Osteoarthritis    knees   Peripheral neuropathy    Peripheral neuropathy    Seasonal allergies    Sinus tachycardia    Sleep apnea    wears cpap    Vitamin B12 deficiency    Vitamin D deficiency     His Past Surgical History Is Significant For: Past Surgical History:  Procedure Laterality Date   angiograms  2008   2 venograms  for SVC blockages   colon cancer resection  2007    chemotherapy, 2/12 malignant abdominal LN (more chemotherapy) 4/12-9/12.    COLON RESECTION  2012   FOLFOX - all complete since 2012   COLONOSCOPY     COLOSTOMY REVISION     COLOSTOMY TAKEDOWN  2008   colostomy in 2007   EYE SURGERY     radial carototomy-right eye   EYE SURGERY     bilateral cataract surgery with lens implants   KNEE SURGERY Right 2007   LAPAROSCOPIC GASTRIC SLEEVE RESECTION N/A 08/16/2017   Procedure: LAPAROSCOPIC GASTRIC SLEEVE RESECTION WITH HIATAL HERNIA REPAIR, UPPER ENDOSCOPY;  Surgeon: Tanda Locus, MD;  Location: [facility];  Service: General;  Laterality: N/A;   porto cath     placement 2012 removed 12/2010   VASECTOMY      His Family History Is Significant For: Family History  Problem Relation Age of Onset   Lymphoma Father    Sleep apnea Brother    Cancer Other     COPD Other    Hypertension Other    Colon cancer Neg Hx    Colon polyps Neg Hx    Esophageal cancer Neg Hx    Rectal cancer Neg Hx    Stomach cancer Neg Hx    Migraines Neg Hx    Seizures Neg Hx    Stroke Neg Hx     His Social History Is Significant For: Social History   Socioeconomic History   Marital status: Divorced    Spouse name: Not on file   Number  of children: 2   Years of education: 12   Highest education level: Not on file  Occupational History    Comment: retired  Tobacco Use   Smoking status: Former    Current packs/day: 0.00    Types: Cigarettes    Quit date: 04/07/1987    Years since quitting: 37.0   Smokeless tobacco: Never  Vaping Use   Vaping status: Never Used  Substance and Sexual Activity   Alcohol use: Yes    Alcohol/week: 1.0 standard drink of alcohol    Types: 1 Cans of beer per week    Comment: SOCIAL   Drug use: No   Sexual activity: Not on file  Other Topics Concern   Not on file  Social History Narrative   Patient is retired.High school education. Patient has two children.Caffeine consumption is 2 dailyPatient is right handed      2 cups of coffee daily   Social Drivers of Health   Tobacco Use: Medium Risk (04/04/2024)   Patient History    Smoking Tobacco Use: Former    Smokeless Tobacco Use: Never    Passive Exposure: Not on Actuary Strain: Not on file  Food Insecurity: Not on file  Transportation Needs: Not on file  Physical Activity: Not on file  Stress: Not on file  Social Connections: Not on file  Depression (EYV7-0): Not on file  Alcohol Screen: Not on file  Housing: Unknown (03/17/2024)   Received from [facility]   Epic    Unable to Pay for Housing in the Last Year: Not on file    Number of Times Moved in the Last Year: Not on file    At any time in the past 12 months, were you homeless or living in a shelter (including now)?: No  Utilities: Not on file  Health Literacy: Not on file     His Allergies Are:  Allergies[1]:   His Current Medications Are:  Outpatient Encounter Medications as of 04/04/2024  Medication Sig   Continuous Glucose Receiver (DEXCOM G7 RECEIVER) DEVI USE TO CHECK BLOOD SUGAR 365 DAYS; Duration: 90   fluticasone  (FLONASE ) 50 MCG/ACT nasal spray Place 2 sprays into the nose daily. Reported on 04/24/2015   losartan (COZAAR) 25 MG tablet Take 25 mg by mouth daily.   MOUNJARO 10 MG/0.5ML Pen 10 mg Subcutaneous Once a week   Multiple Vitamins-Minerals (BARIATRIC MULTIVITAMINS/IRON) CAPS See admin instructions.   oxymetazoline (AFRIN) 0.05 % nasal spray Place 1 spray into both nostrils 2 (two) times daily as needed for congestion.   pseudoephedrine (SUDAFED) 30 MG tablet Take 30 mg by mouth every 4 (four) hours as needed for congestion.   Dulaglutide  (TRULICITY ) 1.5 MG/0.5ML SOPN Inject 1.5 mg into the skin once a week. (Patient not taking: Reported on 04/04/2024)   glucose blood test strip 1 each. Use as instructed. Check cbg q am, alternatinf between AM and PM mels. AC   QID (Patient not taking: Reported on 04/04/2024)   metFORMIN (GLUCOPHAGE) 500 MG tablet 1000 mcg x 2 (Patient not taking: Reported on 04/04/2024)   pantoprazole  (PROTONIX ) 40 MG tablet Take 1 tablet (40 mg total) by mouth daily. (Patient not taking: Reported on 04/04/2024)   TRULICITY  0.75 MG/0.5ML SOPN Inject into the skin. (Patient not taking: Reported on 04/04/2024)   No facility-administered encounter medications on file as of 04/04/2024.  :   Review of Systems:  Out of a complete 14 point review of systems, all are reviewed and  negative with the exception of these symptoms as listed below:  Review of Systems  Objective:  Neurological Exam  Physical Exam Physical Examination:   Vitals:   04/04/24 1548  BP: 124/79  Pulse: 82    General Examination: The patient is a very pleasant 63 y.o. male in no acute distress. He appears well-developed and well-nourished and well  groomed.   HEENT: Normocephalic, atraumatic, pupils are equal, round and reactive to light, extraocular tracking is good without limitation to gaze excursion or nystagmus noted.  Corrective eyeglasses in place.  Hearing is grossly intact. Face is symmetric with normal facial animation. Speech is clear with no dysarthria noted. There is no hypophonia. There is no lip, neck/head, jaw or voice tremor. Neck is supple with full range of passive and active motion. There are no carotid bruits on auscultation. Oropharynx exam reveals: mild to moderate mouth dryness, adequate dental hygiene, mild airway crowding, tonsillar size about 1+, Mallampati class II.  Neck circumference 16-5/8 inches.  Tongue protrudes centrally and palate elevates symmetrically.    Chest: Clear to auscultation without wheezing, rhonchi or crackles noted.   Heart: S1+S2+0, regular and normal without murmurs, rubs or gallops noted.    Abdomen: Soft, non-distended.   Extremities: There is no obvious swelling in the distal lower extremities bilaterally.    Skin: Warm and dry without trophic changes noted.    Musculoskeletal: exam reveals no obvious joint deformities.    Neurologically:  Mental status: The patient is awake, alert and oriented in all 4 spheres. His immediate and remote memory, attention, language skills and fund of knowledge are appropriate. There is no evidence of aphasia, agnosia, apraxia or anomia. Speech is clear with normal prosody and enunciation. Thought process is linear. Mood is normal and affect is normal.  Cranial nerves II - XII are as described above under HEENT exam.  Motor exam: Normal bulk, moving all 4 extremities without any obvious restriction, no obvious action or resting tremor.  Fine motor skills and coordination: grossly intact.  Cerebellar testing: No dysmetria or intention tremor. There is no truncal or gait ataxia.  Sensory exam: intact to light touch in the upper and lower extremities.   Gait, station and balance: He stands easily. No veering to one side is noted. No leaning to one side is noted. Posture is age-appropriate and stance is narrow based. Gait shows normal stride length and normal pace. No problems turning are noted.    Assessment and Plan:  In summary, James Buck is a 63 year old male with an underlying medical history of allergies, recurrent sinus issues, vitamin B12 deficiency, vitamin D deficiency, status post colostomy with status post reversal, colon cancer with status post surgery, bariatric surgery in 2019, diabetes, neuropathy, and mildly overweight state who presents for reevaluation of his longstanding history of obstructive sleep apnea.  He is compliant with his current AutoPap.  Residual AHI is elevated but could be secondary to high leak.  I will write for new supplies.  I will increase his pressure range slightly to a maximum pressure of 16 cm at this time.  He is commended for his treatment adherence.  He is not quite eligible for a new machine.  He has no problem with his current machine and is very motivated to continue with treatment.  He is commended for his treatment adherence.  I am happy to look at another compliance download in about a month or so from now.  Generally speaking, he has not noticed any problems  with the machine.  He is advised to follow-up routinely in this clinic to see one of our nurse practitioners in about a year.  According to the download, there is no obvious increase in central respiratory events.  At any rate, we will keep an eye on his residual AHI as needed.  I answered all his questions today and he was in agreement.   Thank you very much for allowing me to participate in the care of this nice patient. If I can be of any further assistance to you please do not hesitate to call me at [phone].   Sincerely,     True Mar, MD, PhD     [1]  Allergies Allergen Reactions   Statins Other (See Comments)    Body aches    Victoza [Liraglutide] Nausea Only    Prior nausea when taken in AM (tolerates bedtime dosing with no difficulty)

## 2025-04-04 ENCOUNTER — Ambulatory Visit: Admitting: Adult Health
